# Patient Record
Sex: Female | Born: 1970 | Race: White | Hispanic: No | Marital: Married | Smoking: Never smoker
Health system: Southern US, Community
[De-identification: ages and names within clinical notes are randomized; demographics above are authoritative.]

## PROBLEM LIST (undated history)

## (undated) DIAGNOSIS — N951 Menopausal and female climacteric states: Secondary | ICD-10-CM

## (undated) HISTORY — PX: DILATION AND CURETTAGE OF UTERUS: SHX78

## (undated) HISTORY — DX: Menopausal and female climacteric states: N95.1

## (undated) HISTORY — PX: BREAST BIOPSY: SHX20

---

## 2005-11-08 ENCOUNTER — Ambulatory Visit: Payer: Self-pay | Admitting: Obstetrics and Gynecology

## 2006-10-07 ENCOUNTER — Encounter: Payer: Self-pay | Admitting: Maternal & Fetal Medicine

## 2006-11-11 ENCOUNTER — Encounter: Payer: Self-pay | Admitting: Maternal & Fetal Medicine

## 2007-04-22 ENCOUNTER — Inpatient Hospital Stay: Payer: Self-pay | Admitting: Obstetrics and Gynecology

## 2007-04-22 ENCOUNTER — Observation Stay: Payer: Self-pay | Admitting: Obstetrics and Gynecology

## 2007-04-28 ENCOUNTER — Ambulatory Visit: Payer: Self-pay | Admitting: Pediatrics

## 2008-08-18 IMAGING — US US OB DETAIL+14 WK - NRPT [facility]
1 series · 14 of 28 positions shown · non-contrast
Comparison: none

[Series 1: us ob detail+14 wk - nrpt [facility] · 0.22mm/px · 14 of 56 slices shown]
[im 3/56]
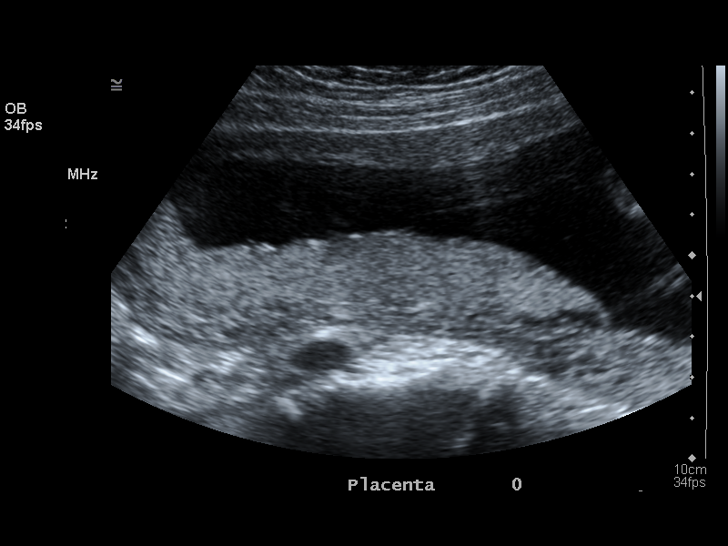
[im 7/56]
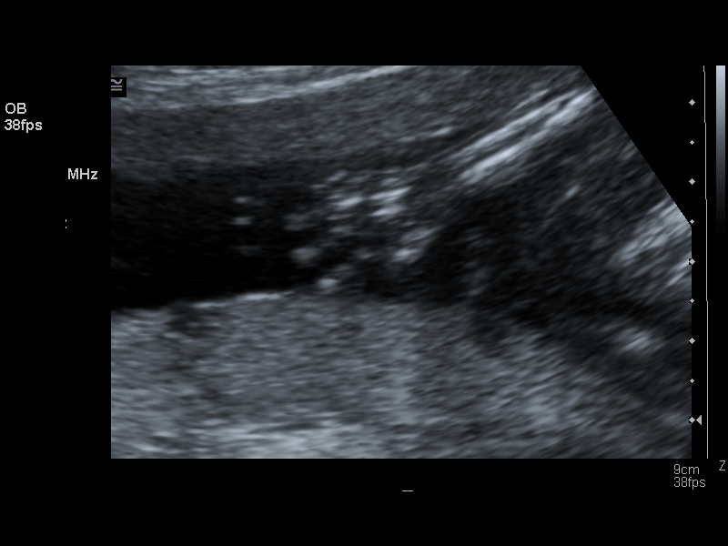
[im 11/56]
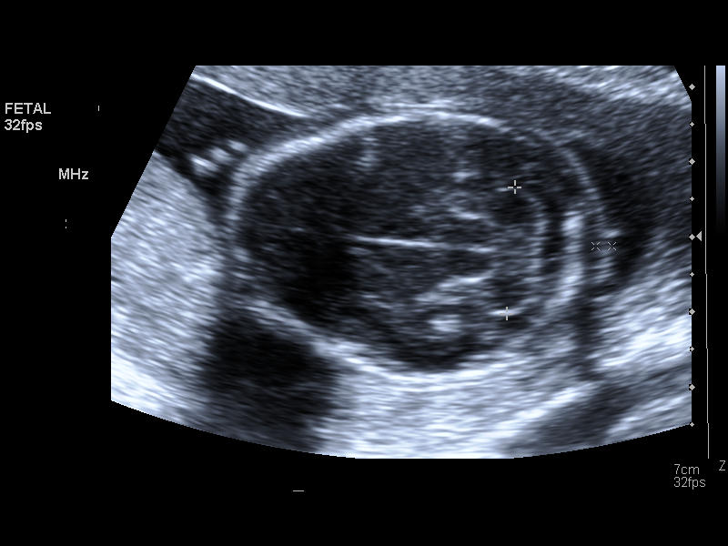
[im 15/56]
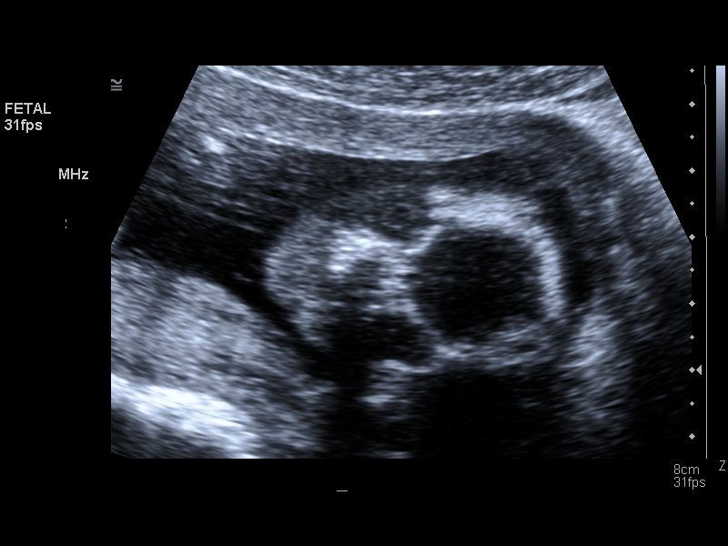
[im 19/56]
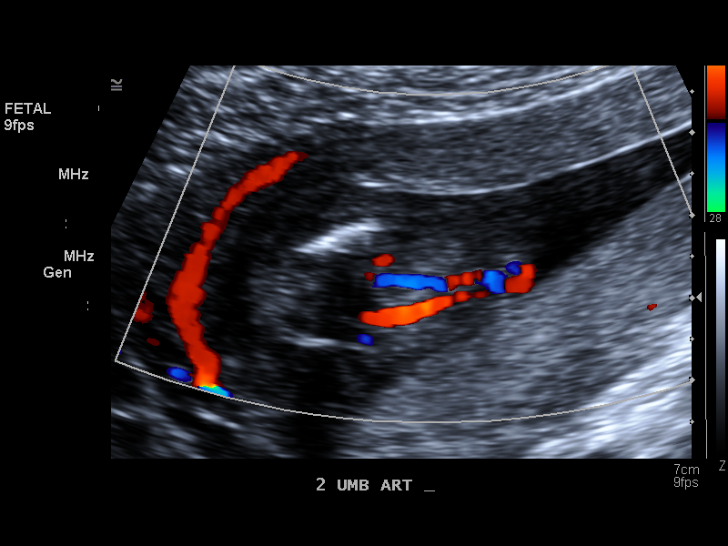
[im 23/56]
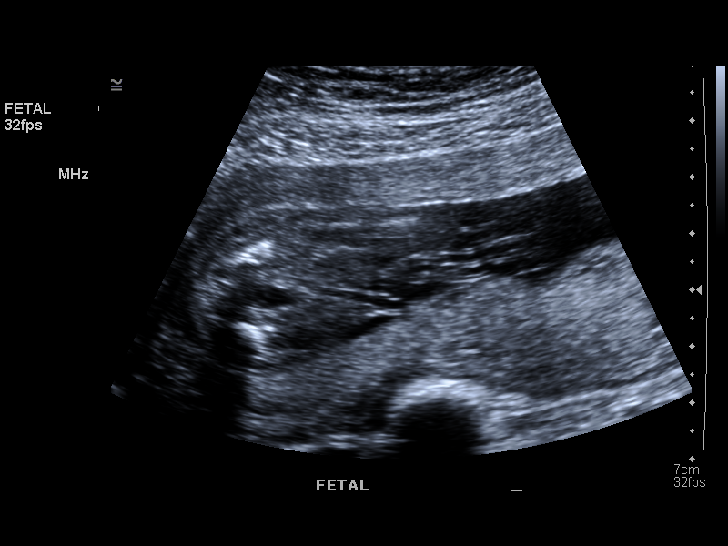
[im 27/56]
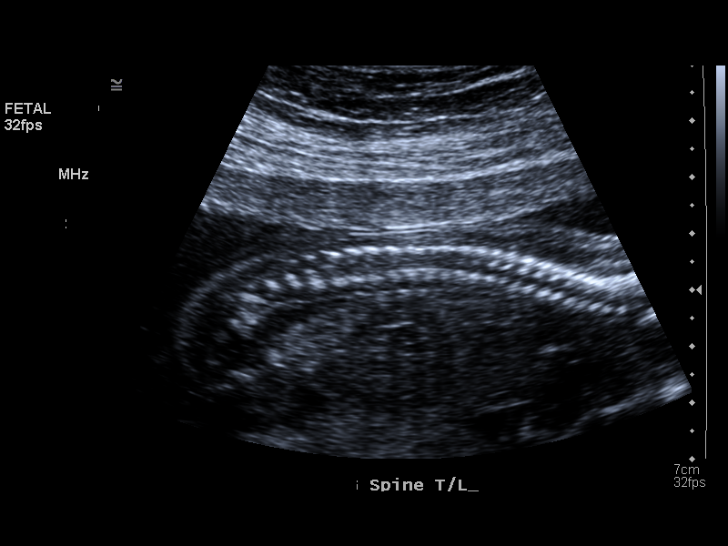
[im 31/56]
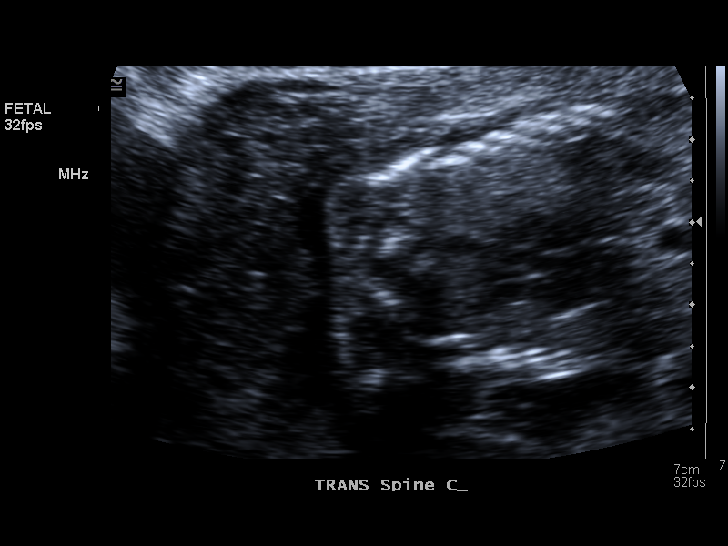
[im 35/56]
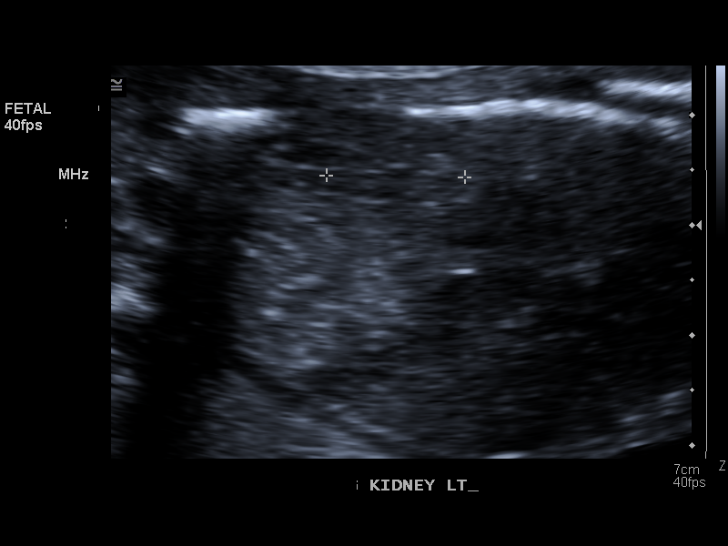
[im 39/56]
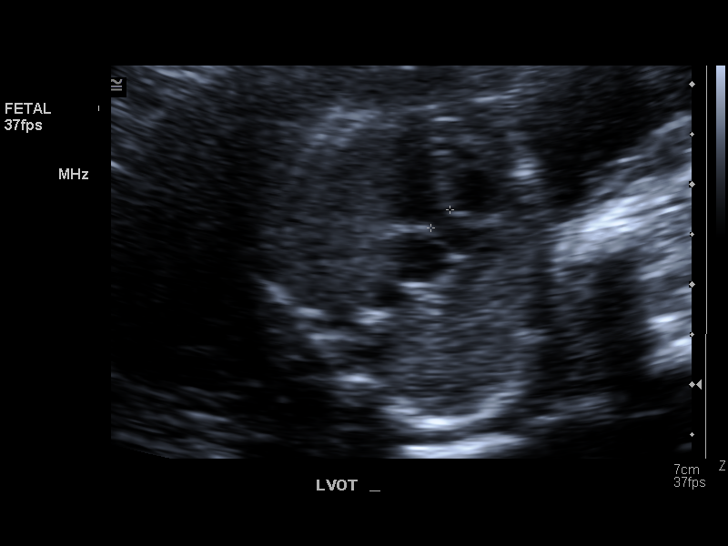
[im 43/56]
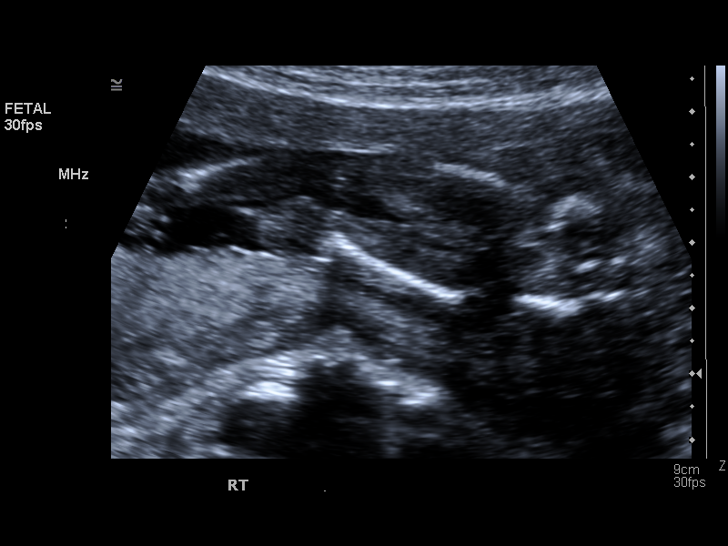
[im 47/56]
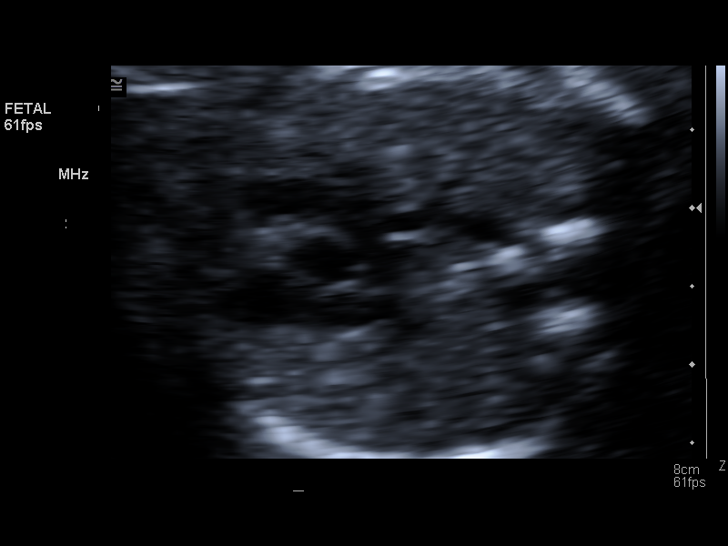
[im 51/56]
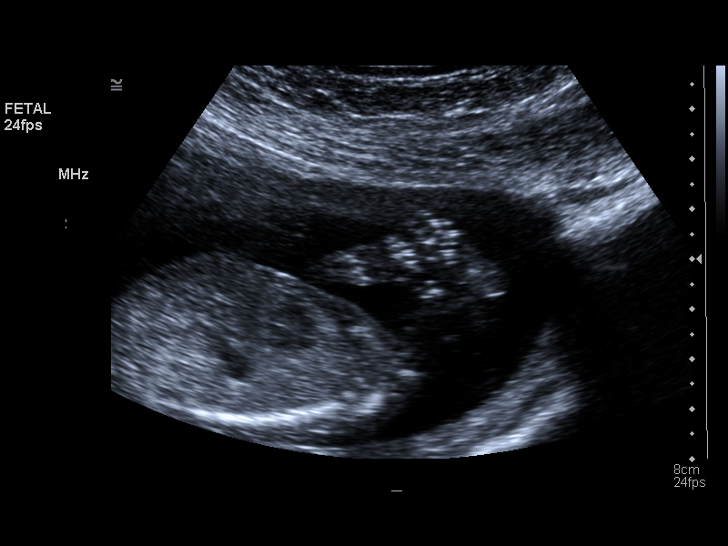
[im 56/56]
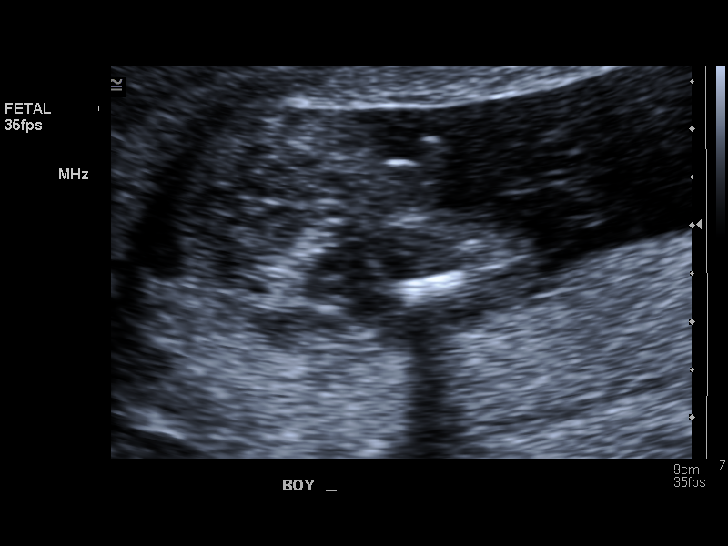

[14 of 28 positions shown; findings below may reference images not displayed]

IMAGES IMPORTED FROM THE SYNGO WORKFLOW SYSTEM
NO DICTATION FOR STUDY

## 2017-04-17 ENCOUNTER — Encounter: Payer: Self-pay | Admitting: Obstetrics and Gynecology

## 2017-04-17 ENCOUNTER — Ambulatory Visit (INDEPENDENT_AMBULATORY_CARE_PROVIDER_SITE_OTHER): Payer: BC Managed Care – PPO | Admitting: Obstetrics and Gynecology

## 2017-04-17 VITALS — BP 110/70 | HR 58 | Ht 60.0 in | Wt 126.0 lb

## 2017-04-17 DIAGNOSIS — Z01419 Encounter for gynecological examination (general) (routine) without abnormal findings: Secondary | ICD-10-CM

## 2017-04-17 DIAGNOSIS — Z124 Encounter for screening for malignant neoplasm of cervix: Secondary | ICD-10-CM

## 2017-04-17 DIAGNOSIS — Z1151 Encounter for screening for human papillomavirus (HPV): Secondary | ICD-10-CM

## 2017-04-17 DIAGNOSIS — Z1231 Encounter for screening mammogram for malignant neoplasm of breast: Secondary | ICD-10-CM

## 2017-04-17 DIAGNOSIS — Z30431 Encounter for routine checking of intrauterine contraceptive device: Secondary | ICD-10-CM | POA: Diagnosis not present

## 2017-04-17 DIAGNOSIS — Z1239 Encounter for other screening for malignant neoplasm of breast: Secondary | ICD-10-CM

## 2017-04-17 NOTE — Patient Instructions (Signed)
  [facility]: [phone]

## 2017-04-17 NOTE — Progress Notes (Signed)
PCP:  Patient, No Pcp Per   Chief Complaint  Patient presents with  . Gynecologic Exam     HPI:      Sylvia Perez is a 46 y.o. G2P1011 who LMP was Patient's last menstrual period was 04/15/2017., presents today for her annual examination.  Her menses are irregular with IUD. Spotting only.  Dysmenorrhea mild, occurring first 1-2 days of flow. She does not have intermenstrual bleeding. She does have night sweats/sleep disturbance.  Sex activity: single partner, contraception - IUD. Mirena placed 10/13. Pt would like another one.  Last Pap: October 25, 2014  Results were: no abnormalities /neg HPV DNA  Hx of STDs: none  Last mammogram: March 08, 2016  Results were: normal--routine follow-up in 12 months There is a FH of breast cancer in her pat great aunt #1. There is a FH of ovarian cancer in her pat grt aunt #1. Pt doesn't qualify for cancer genetic testing. The patient does not do self-breast exams.  Tobacco use: The patient denies current or previous tobacco use. Alcohol use: none No drug use.  Exercise: moderately active  She does get adequate calcium and Vitamin D in her diet.   Past Medical History:  Diagnosis Date  . Perimenopausal vasomotor symptoms     Past Surgical History:  Procedure Laterality Date  . DILATION AND CURETTAGE OF UTERUS      Family History  Problem Relation Age of Onset  . Breast cancer Other   . Ovarian cancer Other        vs cervical cancer    Social History   Social History  . Marital status: Married    Spouse name: N/A  . Number of children: N/A  . Years of education: N/A   Occupational History  . Not on file.   Social History Main Topics  . Smoking status: Never Smoker  . Smokeless tobacco: Never Used  . Alcohol use No  . Drug use: No  . Sexual activity: Yes    Birth control/ protection: IUD   Other Topics Concern  . Not on file   Social History Narrative  . No narrative on file    Current Meds    Medication Sig  . levonorgestrel (MIRENA) 20 MCG/24HR IUD 1 each by Intrauterine route once.     ROS:  Review of Systems  Constitutional: Negative for fatigue, fever and unexpected weight change.  Respiratory: Negative for cough, shortness of breath and wheezing.   Cardiovascular: Negative for chest pain, palpitations and leg swelling.  Gastrointestinal: Negative for blood in stool, constipation, diarrhea, nausea and vomiting.  Endocrine: Negative for cold intolerance, heat intolerance and polyuria.  Genitourinary: Negative for dyspareunia, dysuria, flank pain, frequency, genital sores, hematuria, menstrual problem, pelvic pain, urgency, vaginal bleeding, vaginal discharge and vaginal pain.  Musculoskeletal: Negative for back pain, joint swelling and myalgias.  Skin: Negative for rash.  Neurological: Negative for dizziness, syncope, light-headedness, numbness and headaches.  Hematological: Negative for adenopathy.  Psychiatric/Behavioral: Negative for agitation, confusion, sleep disturbance and suicidal ideas. The patient is not nervous/anxious.      Objective: BP 110/70   Pulse (!) 58   Ht 5' (1.524 m)   Wt 126 lb (57.2 kg)   LMP 04/15/2017   BMI 24.61 kg/m    Physical Exam  Constitutional: She is oriented to person, place, and time. She appears well-developed and well-nourished.  Genitourinary: Vagina normal and uterus normal. There is no rash or tenderness on the right labia. There is  no rash or tenderness on the left labia. No erythema or tenderness in the vagina. No vaginal discharge found. Right adnexum does not display mass and does not display tenderness. Left adnexum does not display mass and does not display tenderness. Cervix does not exhibit motion tenderness or polyp. Uterus is not enlarged or tender.  Neck: Normal range of motion. No thyromegaly present.  Cardiovascular: Normal rate, regular rhythm and normal heart sounds.   No murmur heard. Pulmonary/Chest:  Effort normal and breath sounds normal. Right breast exhibits no mass, no nipple discharge, no skin change and no tenderness. Left breast exhibits no mass, no nipple discharge, no skin change and no tenderness.  Abdominal: Soft. There is no tenderness. There is no guarding.  Musculoskeletal: Normal range of motion.  Neurological: She is alert and oriented to person, place, and time. No cranial nerve deficit.  Psychiatric: She has a normal mood and affect. Her behavior is normal.  Vitals reviewed.    Assessment/Plan: Encounter for annual routine gynecological examination  Cervical cancer screening - Plan: IGP, Aptima HPV  Screening for HPV (human papillomavirus) - Plan: IGP, Aptima HPV  Encounter for routine checking of intrauterine contraceptive device (IUD) - IUD in place. Due for rem 10/18. RTO for removal/insertion. NSAIDs.   Screening for breast cancer - Pt to sched mammo. - Plan: MM DIGITAL SCREENING BILATERAL           GYN counsel breast self exam, mammography screening, family planning choices, adequate intake of calcium and vitamin D, diet and exercise     F/U  Return in about 1 year (around 04/17/2018).  Sylvia B. Copland, PA-C 04/17/2017 11:20 AM

## 2017-04-19 LAB — IGP, APTIMA HPV
HPV Aptima: NEGATIVE
PAP Smear Comment: 0

## 2017-04-24 ENCOUNTER — Encounter: Payer: Self-pay | Admitting: Obstetrics and Gynecology

## 2017-04-29 ENCOUNTER — Encounter: Payer: Self-pay | Admitting: Obstetrics and Gynecology

## 2017-05-29 ENCOUNTER — Telehealth: Payer: Self-pay | Admitting: Obstetrics and Gynecology

## 2017-05-29 NOTE — Telephone Encounter (Signed)
Pt is schedule 06/05/17 with [facility] for mirena removal and insertion

## 2017-06-05 ENCOUNTER — Ambulatory Visit: Payer: BC Managed Care – PPO | Admitting: Obstetrics and Gynecology

## 2017-06-05 ENCOUNTER — Encounter: Payer: Self-pay | Admitting: Obstetrics and Gynecology

## 2017-06-05 VITALS — BP 120/80 | HR 59 | Ht 60.0 in | Wt 129.0 lb

## 2017-06-05 DIAGNOSIS — Z30433 Encounter for removal and reinsertion of intrauterine contraceptive device: Secondary | ICD-10-CM | POA: Diagnosis not present

## 2017-06-05 MED ORDER — LEVONORGESTREL 20 MCG/24HR IU IUD: 1.0000 | INTRAUTERINE_SYSTEM | Freq: Once | INTRAUTERINE | 0 refills | Status: DC

## 2017-06-05 NOTE — Patient Instructions (Signed)
I value your feedback and appreciate you entrusting us with your care. If you get a [facility] patient survey, I would appreciate you taking the time to let us know what your experience was like. Thank you!   [facility] [phone]  Instructions after IUD insertion  Most women experience no significant problems after insertion of an IUD, however minor cramping and spotting for a few days is common. Cramps may be treated with ibuprofen 800mg  every 8 hours or Tylenol 650 mg every 4 hours. Contact [facility] immediately if you experience any of the following symptoms during the next week: temperature >99.6 degrees, worsening pelvic pain, abdominal pain, fainting, unusually heavy vaginal bleeding, foul vaginal discharge, or if you think you have expelled the IUD.  Nothing inserted in the vagina for 48 hours. You will be scheduled for a follow up visit in approximately four weeks.  You should check monthly to be sure you can feel the IUD strings in the upper vagina. If you are having a monthly period, try to check after each period. If you cannot feel the IUD strings,  contact [facility] immediately so we can do an exam to determine if the IUD has been expelled.   Please use backup protection until we can confirm the IUD is in place.  Call [facility] if you are exposed to or diagnosed with a sexually transmitted infection, as we will need to discuss whether it is safe for you to continue using an IUD.

## 2017-06-05 NOTE — Progress Notes (Signed)
   Chief Complaint  Patient presents with  . Contraception     History of Present Illness:  Sylvia LarocheChristina Perez is a 46 y.o. that had a Mirena IUD placed approximately 5 years ago. Since that time, she denies dyspareunia, pelvic pain, non-menstrual bleeding, vaginal d/c, heavy bleeding. She would like another Mirena.   BP 120/80   Pulse (!) 59   Ht 5' (1.524 m)   Wt 129 lb (58.5 kg)   LMP 06/03/2017   BMI 25.19 kg/m   Pelvic exam:  Two IUD strings present seen coming from the cervical os. EGBUS, vaginal vault and cervix: within normal limits  IUD Removal Strings of IUD identified and grasped.  IUD removed without problem with ring forceps.  Pt tolerated this well.  IUD noted to be intact.   IUD Insertion Procedure Note Patient identified, informed consent performed, consent signed.   Discussed risks of irregular bleeding, cramping, infection, malpositioning or misplacement of the IUD outside the uterus which may require further procedure such as laparoscopy, risk of failure <1%. Time out was performed.    Speculum placed in the vagina.  Cervix visualized.  Cleaned with Betadine x 2.  Grasped anteriorly with a single tooth tenaculum.  Uterus sounded to 7.0 cm.   IUD placed per manufacturer's recommendations.  Strings trimmed to 3 cm. Tenaculum was removed, good hemostasis noted.  Patient tolerated procedure well.   ASSESSMENT:  Encounter for removal and reinsertion of intrauterine contraceptive device (IUD) - Plan: levonorgestrel (MIRENA) 20 MCG/24HR IUD   Meds ordered this encounter  Medications  . levonorgestrel (MIRENA) 20 MCG/24HR IUD    Sig: 1 Intra Uterine Device (1 each total) once for 1 dose by Intrauterine route.    Dispense:  1 each    Refill:  0     Plan:  Patient was given post-procedure instructions.  She was advised to have backup contraception for one week.   Call if you are having increasing pain, cramps or bleeding or if you have a fever greater than  100.4 degrees F., shaking chills, nausea or vomiting. Patient was also asked to check IUD strings periodically and follow up in 4 weeks for IUD check.  Return in about 4 weeks (around 07/03/2017) for IUD check.  Mehdi Gironda B. Jaecion Dempster, PA-C 06/05/2017 3:58 PM

## 2017-06-12 NOTE — Telephone Encounter (Signed)
Mirena stock was provided for this patient. 

## 2017-07-04 ENCOUNTER — Encounter: Payer: Self-pay | Admitting: Obstetrics and Gynecology

## 2017-07-04 ENCOUNTER — Ambulatory Visit (INDEPENDENT_AMBULATORY_CARE_PROVIDER_SITE_OTHER): Payer: BC Managed Care – PPO | Admitting: Obstetrics and Gynecology

## 2017-07-04 VITALS — BP 100/60 | HR 63 | Ht 60.0 in | Wt 130.0 lb

## 2017-07-04 DIAGNOSIS — Z30431 Encounter for routine checking of intrauterine contraceptive device: Secondary | ICD-10-CM | POA: Diagnosis not present

## 2017-07-04 NOTE — Patient Instructions (Signed)
I value your feedback and entrusting us with your care. If you get a [facility] patient survey, I would appreciate you taking the time to let us know about your experience today. Thank you! 

## 2017-07-04 NOTE — Progress Notes (Signed)
   Chief Complaint  Patient presents with  . Contraception     History of Present Illness:  Cindie LarocheChristina Betterton is a 46 y.o. that had a Mirena IUD placed approximately 1 month ago. Since that time, she denies dyspareunia, pelvic pain, non-menstrual bleeding, vaginal d/c, heavy bleeding.   Review of Systems  Constitutional: Negative for fever.  Gastrointestinal: Negative for blood in stool, constipation, diarrhea, nausea and vomiting.  Genitourinary: Negative for dyspareunia, dysuria, flank pain, frequency, hematuria, urgency, vaginal bleeding, vaginal discharge and vaginal pain.  Musculoskeletal: Negative for back pain.  Skin: Negative for rash.    Physical Exam:  BP 100/60   Pulse 63   Ht 5' (1.524 m)   Wt 130 lb (59 kg)   LMP 06/29/2017   BMI 25.39 kg/m  Body mass index is 25.39 kg/m.  Pelvic exam:  Two IUD strings present seen coming from the cervical os. EGBUS, vaginal vault and cervix: within normal limits   Assessment:  Routine checking of IUD Encounter for routine checking of intrauterine contraceptive device (IUD)  IUD strings present in proper location; pt doing well  Plan: F/u if any signs of infection or can no longer feel the strings.   Denim Start B. Mycala Warshawsky, PA-C 07/04/2017 3:41 PM

## 2020-10-27 NOTE — Patient Instructions (Signed)
I value your feedback and you entrusting us with your care. If you get a [facility] patient survey, I would appreciate you taking the time to let us know about your experience today. Thank you! ? ? ?

## 2020-10-27 NOTE — Progress Notes (Signed)
PCP:  Patient, No Pcp Per (Inactive)   Chief Complaint  Patient presents with  . Gynecologic Exam    No concerns     HPI:      Ms. Rexann Lueras is a 50 y.o. G2P1011 who LMP was No LMP recorded. (Menstrual status: IUD)., presents today for her NP> 3 yrs annual examination.  Her menses are monthly spotting with IUD, lasting 3-5 days.  Dysmenorrhea mild, occurring first 1-2 days of flow. She rarely has intermenstrual bleeding.   Sex activity: single partner, contraception - IUD. Mirena replaced 06/05/17. Has decreased libido.  Last Pap: 04/17/17  Results were: no abnormalities /neg HPV DNA  Hx of STDs: none  Last mamm[facility]: 04/24/17 at [facility]; Results were: normal--routine follow-up in 12 months There is no FH of breast cancer or ovarian cancer. Previous pat grt aunts with cancer turned out to not be of blood relation to pt. The patient does not do self-breast exams.  Tobacco use: The patient denies current or previous tobacco use. Alcohol use: none No drug use.  Exercise: moderately active  Colonoscopy: never  She does get adequate calcium and Vitamin D in her diet.   Past Medical History:  Diagnosis Date  . Perimenopausal vasomotor symptoms     Past Surgical History:  Procedure Laterality Date  . DILATION AND CURETTAGE OF UTERUS      Family History  Problem Relation Age of Onset  . Breast cancer Neg Hx   . Ovarian cancer Neg Hx     Social History   Socioeconomic History  . Marital status: Married    Spouse name: Not on file  . Number of children: Not on file  . Years of education: Not on file  . Highest education level: Not on file  Occupational History  . Not on file  Tobacco Use  . Smoking status: Never Smoker  . Smokeless tobacco: Never Used  Vaping Use  . Vaping Use: Never used  Substance and Sexual Activity  . Alcohol use: No  . Drug use: No  . Sexual activity: Yes    Birth control/protection: I.U.D.    Comment: Mirena  Other Topics Concern   . Not on file  Social History Narrative  . Not on file   Social Determinants of Health   Financial Resource Strain: Not on file  Food Insecurity: Not on file  Transportation Needs: Not on file  Physical Activity: Not on file  Stress: Not on file  Social Connections: Not on file  Intimate Partner Violence: Not on file    Current Meds  Medication Sig  . Cholecalciferol (VITAMIN D3) 10 MCG (400 UNIT) tablet Take by mouth.     ROS:  Review of Systems  Constitutional: Negative for fatigue, fever and unexpected weight change.  Respiratory: Negative for cough, shortness of breath and wheezing.   Cardiovascular: Negative for chest pain, palpitations and leg swelling.  Gastrointestinal: Negative for blood in stool, constipation, diarrhea, nausea and vomiting.  Endocrine: Negative for cold intolerance, heat intolerance and polyuria.  Genitourinary: Negative for dyspareunia, dysuria, flank pain, frequency, genital sores, hematuria, menstrual problem, pelvic pain, urgency, vaginal bleeding, vaginal discharge and vaginal pain.  Musculoskeletal: Negative for back pain, joint swelling and myalgias.  Skin: Negative for rash.  Neurological: Negative for dizziness, syncope, light-headedness, numbness and headaches.  Hematological: Negative for adenopathy.  Psychiatric/Behavioral: Negative for agitation, confusion, sleep disturbance and suicidal ideas. The patient is not nervous/anxious.      Objective: BP 114/70   Ht  5' (1.524 m)   Wt 136 lb (61.7 kg)   BMI 26.56 kg/m    Physical Exam Constitutional:      Appearance: She is well-developed.  Genitourinary:     Vulva normal.     Right Labia: No rash, tenderness or lesions.    Left Labia: No tenderness, lesions or rash.    No vaginal discharge, erythema or tenderness.      Right Adnexa: not tender and no mass present.    Left Adnexa: not tender and no mass present.    No cervical motion tenderness, friability or polyp.     IUD  strings visualized.     Uterus is not enlarged or tender.  Breasts:     Right: No mass, nipple discharge, skin change or tenderness.     Left: No mass, nipple discharge, skin change or tenderness.    Neck:     Thyroid: No thyromegaly.  Cardiovascular:     Rate and Rhythm: Normal rate and regular rhythm.     Heart sounds: Normal heart sounds. No murmur heard.   Pulmonary:     Effort: Pulmonary effort is normal.     Breath sounds: Normal breath sounds.  Abdominal:     Palpations: Abdomen is soft.     Tenderness: There is no abdominal tenderness. There is no guarding or rebound.  Musculoskeletal:        General: Normal range of motion.     Cervical back: Normal range of motion.  Lymphadenopathy:     Cervical: No cervical adenopathy.  Neurological:     General: No focal deficit present.     Mental Status: She is alert and oriented to person, place, and time.     Cranial Nerves: No cranial nerve deficit.  Skin:    General: Skin is warm and dry.  Psychiatric:        Mood and Affect: Mood normal.        Behavior: Behavior normal.        Thought Content: Thought content normal.        Judgment: Judgment normal.  Vitals reviewed.      Assessment/Plan: Encounter for annual routine gynecological examination  Encounter for routine checking of intrauterine contraceptive device (IUD); due for removal 02/2024  Encounter for screening mammogram for malignant neoplasm of breast - Plan: MM 3D SCREEN BREAST BILATERAL; pt to sched mammo  Screening for colon cancer - Plan: Cologuard; colonoscopy/cologuard discussed. Pt elects cologuard. Ref sent. Will f/u with results.          GYN counsel breast self exam, mammography screening, adequate intake of calcium and vitamin D, diet and exercise     F/U  Return in about 1 year (around 10/28/2021).  Hailie Searight B. Paisli Silfies, PA-C 10/28/2020 3:39 PM

## 2020-10-28 ENCOUNTER — Other Ambulatory Visit: Payer: Self-pay

## 2020-10-28 ENCOUNTER — Ambulatory Visit (INDEPENDENT_AMBULATORY_CARE_PROVIDER_SITE_OTHER): Payer: BC Managed Care – PPO | Admitting: Obstetrics and Gynecology

## 2020-10-28 ENCOUNTER — Encounter: Payer: Self-pay | Admitting: Obstetrics and Gynecology

## 2020-10-28 VITALS — BP 114/70 | Ht 60.0 in | Wt 136.0 lb

## 2020-10-28 DIAGNOSIS — Z01419 Encounter for gynecological examination (general) (routine) without abnormal findings: Secondary | ICD-10-CM | POA: Diagnosis not present

## 2020-10-28 DIAGNOSIS — Z30431 Encounter for routine checking of intrauterine contraceptive device: Secondary | ICD-10-CM

## 2020-10-28 DIAGNOSIS — Z1211 Encounter for screening for malignant neoplasm of colon: Secondary | ICD-10-CM

## 2020-10-28 DIAGNOSIS — Z1231 Encounter for screening mammogram for malignant neoplasm of breast: Secondary | ICD-10-CM | POA: Diagnosis not present

## 2020-12-14 ENCOUNTER — Telehealth: Payer: Self-pay | Admitting: Obstetrics and Gynecology

## 2020-12-14 NOTE — Telephone Encounter (Signed)
Pt with Cat 4B mammo results RT breast at [facility]. Has bx on 12/16/20. Will wait for results and f/u. Questions answered.

## 2020-12-16 ENCOUNTER — Emergency Department
Admission: EM | Admit: 2020-12-16 | Discharge: 2020-12-16 | Disposition: A | Discharge status: Home / Self Care | Payer: BC Managed Care – PPO | Attending: Emergency Medicine | Admitting: Emergency Medicine

## 2020-12-16 DIAGNOSIS — Z5189 Encounter for other specified aftercare: Secondary | ICD-10-CM | POA: Insufficient documentation

## 2020-12-16 DIAGNOSIS — Z4800 Encounter for change or removal of nonsurgical wound dressing: Secondary | ICD-10-CM | POA: Diagnosis present

## 2020-12-16 MED ORDER — ALPRAZOLAM 0.25 MG PO TABS
0.2500 mg | ORAL_TABLET | Freq: Once | ORAL | Status: AC
  Administered 2020-12-16: 0.25 mg via ORAL
  Filled 2020-12-16: qty 1

## 2020-12-16 NOTE — ED Triage Notes (Addendum)
Pt reports having right breast core needle biopsy done this morning, states approx 1 hour ago she sneezed, causing severe pain and "gushing of blood" from the site. Reports taking tylenol 2 hours PTA. Bleeding controlled at this time. New dressing placed in traige. Steri strips remain in place.

## 2020-12-16 NOTE — ED Provider Notes (Signed)
[facility]-EMERGENCY DEPARTMENT  ____________________________________________  Time seen: Approximately 9:30 PM  I have reviewed the triage vital signs and the nursing notes.   HISTORY  Chief Complaint Post-op Problem   Historian Patient   HPI Sylvia Perez is a 50 y.o. female presents to the emergency department after for a right breast core needle biopsy wound check.  Patient reports that she underwent procedure this morning and this afternoon she sneezed and biopsy site began bleeding.  She states that she soaked through a bandage and could not stop bleeding at home.  Bleeding had resolved while waiting in the emergency department.   Past Medical History:  Diagnosis Date  . Perimenopausal vasomotor symptoms      Immunizations up to date:  Yes.     Past Medical History:  Diagnosis Date  . Perimenopausal vasomotor symptoms     There are no problems to display for this patient.   Past Surgical History:  Procedure Laterality Date  . BREAST BIOPSY    . DILATION AND CURETTAGE OF UTERUS      Prior to Admission medications   Medication Sig Start Date End Date Taking? Authorizing Provider  Cholecalciferol (VITAMIN D3) 10 MCG (400 UNIT) tablet Take by mouth.    [provider]  levonorgestrel (MIRENA) 20 MCG/24HR IUD 1 Intra Uterine Device (1 each total) once for 1 dose by Intrauterine route. 06/05/17 06/05/17  Copland, Ilona Sorrel, PA-C    Allergies Patient has no known allergies.  Family History  Problem Relation Age of Onset  . Breast cancer Neg Hx   . Ovarian cancer Neg Hx     Social History Social History   Tobacco Use  . Smoking status: Never Smoker  . Smokeless tobacco: Never Used  Vaping Use  . Vaping Use: Never used  Substance Use Topics  . Alcohol use: No  . Drug use: No     Review of Systems  Constitutional: No fever/chills Eyes:  No discharge ENT: No upper respiratory complaints. Respiratory: no cough. No SOB/ use of accessory  muscles to breath Gastrointestinal:   No nausea, no vomiting.  No diarrhea.  No constipation. Musculoskeletal: Negative for musculoskeletal pain. Skin: Patient has wound concern.    ____________________________________________   PHYSICAL EXAM:  VITAL SIGNS: ED Triage Vitals  Enc Vitals Group     BP 12/16/20 1943 118/81     Pulse Rate 12/16/20 1940 85     Resp 12/16/20 1940 18     Temp 12/16/20 1940 98.3 F (36.8 C)     Temp src --      SpO2 12/16/20 1940 94 %     Weight --      Height --      Head Circumference --      Peak Flow --      Pain Score 12/16/20 1940 6     Pain Loc --      Pain Edu? --      Excl. in GC? --      Constitutional: Alert and oriented. Well appearing and in no acute distress. Eyes: Conjunctivae are normal. PERRL. EOMI. Head: Atraumatic. ENT: Cardiovascular: Normal rate, regular rhythm. Normal S1 and S2.  Good peripheral circulation. Respiratory: Normal respiratory effort without tachypnea or retractions. Lungs CTAB. Good air entry to the bases with no decreased or absent breath sounds Gastrointestinal: Bowel sounds x 4 quadrants. Soft and nontender to palpation. No guarding or rigidity. No distention. Musculoskeletal: Full range of motion to all extremities. No obvious deformities noted  Neurologic:  Normal for age. No gross focal neurologic deficits are appreciated.  Skin: Patient has right breast core needle biopsy wound site that is not actively bleeding. Psychiatric: Mood and affect are normal for age. Speech and behavior are normal.   ____________________________________________   LABS (all labs ordered are listed, but only abnormal results are displayed)  Labs Reviewed - No data to display ____________________________________________  EKG   ____________________________________________  RADIOLOGY   No results found.  ____________________________________________    PROCEDURES  Procedure(s) performed:      Procedures     Medications  ALPRAZolam (XANAX) tablet 0.25 mg (has no administration in time range)     ____________________________________________   INITIAL IMPRESSION / ASSESSMENT AND PLAN / ED COURSE  Pertinent labs & imaging results that were available during my care of the patient were reviewed by me and considered in my medical decision making (see chart for details).      Assessment and plan Wound check Anxiety 50 year old female presents to the emergency department for a right core needle biopsy wound check of the right breast.  Patient had no active bleeding.  Patient's dressing was changed and low-dose Xanax was given prior to discharge for anxiety.  Patient education regarding wound care was given.  All patient questions were answered.     ____________________________________________  FINAL CLINICAL IMPRESSION(S) / ED DIAGNOSES  Final diagnoses:  Visit for wound check      NEW MEDICATIONS STARTED DURING THIS VISIT:  ED Discharge Orders    None          This chart was dictated using voice recognition software/Dragon. Despite best efforts to proofread, errors can occur which can change the meaning. Any change was purely unintentional.     Gasper Lloyd 12/16/20 2132    Phineas Semen, MD 12/16/20 2154

## 2021-01-03 ENCOUNTER — Telehealth: Payer: Self-pay

## 2021-01-03 NOTE — Telephone Encounter (Signed)
Called pt to f/u on test, is she still planning on completing? Pt says yes, she is just having a hard time finding time to complete.

## 2022-03-27 ENCOUNTER — Ambulatory Visit (INDEPENDENT_AMBULATORY_CARE_PROVIDER_SITE_OTHER): Payer: BC Managed Care – PPO | Admitting: Obstetrics and Gynecology

## 2022-03-27 ENCOUNTER — Other Ambulatory Visit (HOSPITAL_COMMUNITY)
Admission: RE | Admit: 2022-03-27 | Discharge: 2022-03-27 | Disposition: A | Discharge status: Home / Self Care | Payer: BC Managed Care – PPO | Source: Ambulatory Visit | Attending: Obstetrics and Gynecology | Admitting: Obstetrics and Gynecology

## 2022-03-27 ENCOUNTER — Encounter: Payer: Self-pay | Admitting: Obstetrics and Gynecology

## 2022-03-27 VITALS — BP 100/70 | Ht 60.0 in | Wt 140.0 lb

## 2022-03-27 DIAGNOSIS — Z1151 Encounter for screening for human papillomavirus (HPV): Secondary | ICD-10-CM | POA: Diagnosis not present

## 2022-03-27 DIAGNOSIS — K5901 Slow transit constipation: Secondary | ICD-10-CM

## 2022-03-27 DIAGNOSIS — Z01419 Encounter for gynecological examination (general) (routine) without abnormal findings: Secondary | ICD-10-CM | POA: Diagnosis not present

## 2022-03-27 DIAGNOSIS — Z13220 Encounter for screening for lipoid disorders: Secondary | ICD-10-CM

## 2022-03-27 DIAGNOSIS — Z1211 Encounter for screening for malignant neoplasm of colon: Secondary | ICD-10-CM

## 2022-03-27 DIAGNOSIS — Z124 Encounter for screening for malignant neoplasm of cervix: Secondary | ICD-10-CM | POA: Insufficient documentation

## 2022-03-27 DIAGNOSIS — Z30431 Encounter for routine checking of intrauterine contraceptive device: Secondary | ICD-10-CM

## 2022-03-27 DIAGNOSIS — Z1231 Encounter for screening mammogram for malignant neoplasm of breast: Secondary | ICD-10-CM

## 2022-03-27 DIAGNOSIS — Z0000 Encounter for general adult medical examination without abnormal findings: Secondary | ICD-10-CM

## 2022-03-27 NOTE — Patient Instructions (Signed)
I value your feedback and you entrusting us with your care. If you get a [facility] patient survey, I would appreciate you taking the time to let us know about your experience today. Thank you!  [facility]: [phone]      

## 2022-03-27 NOTE — Progress Notes (Signed)
PCP:  [facility]   Chief Complaint  Patient presents with   Gynecologic Exam    Feeling bloated, constipation x 6 months     HPI:      [name] is a 51 y.o. G2P1011 who LMP was No LMP recorded. (Menstrual status: IUD)., presents today for her annual examination.  Her menses are infrequent to absent now with IUD, lasting 1-2 days light flow. No bleeding for 6 months; were monthly last yr.  No BTB, no dysmen. Is having some vasomotor sx now.   Sex activity: single partner, contraception - IUD. Mirena replaced 06/05/17. No pain; dryness improved with lubricants. Last Pap: 04/17/17  Results were: no abnormalities /neg HPV DNA  Hx of STDs: none  Last mammogram: 12/14/20 at [facility]; Results were: cat 4B RT breast with neg bx; repeat mammo due 12 months.  There is no FH of breast cancer or ovarian cancer. Previous pat grt aunts with cancer turned out to not be of blood relation to pt. The patient does not do self-breast exams.  Tobacco use: The patient denies current or previous tobacco use. Alcohol use: none No drug use.  Exercise: moderately active  Colonoscopy: never; cologuard ordered 2022, not done. Pt having issues with constipation and bloating. Added probiotics last month with some improvement but still with sx. No rectal bleeding.   She does get adequate calcium and Vitamin D in her diet. No recent labs  Past Medical History:  Diagnosis Date   Perimenopausal vasomotor symptoms     Past Surgical History:  Procedure Laterality Date   BREAST BIOPSY     DILATION AND CURETTAGE OF UTERUS      Family History  Problem Relation Age of Onset   Dementia Mother    Lymphoma Mother    Breast cancer Neg Hx    Ovarian cancer Neg Hx     Social History   Socioeconomic History   Marital status: Married    Spouse name: Not on file   Number of children: Not on file   Years of education: Not on file   Highest education level: Not on file  Occupational  History   Not on file  Tobacco Use   Smoking status: Never   Smokeless tobacco: Never  Vaping Use   Vaping Use: Never used  Substance and Sexual Activity   Alcohol use: No   Drug use: No   Sexual activity: Yes    Birth control/protection: I.U.D.    Comment: Mirena  Other Topics Concern   Not on file  Social History Narrative   Not on file   Social Determinants of Health   Financial Resource Strain: Not on file  Food Insecurity: Not on file  Transportation Needs: Not on file  Physical Activity: Not on file  Stress: Not on file  Social Connections: Not on file  Intimate Partner Violence: Not on file    Current Meds  Medication Sig   ascorbic acid (VITAMIN C) 100 MG tablet Take by mouth.   Cholecalciferol (VITAMIN D3) 10 MCG (400 UNIT) tablet Take by mouth.     ROS:  Review of Systems  Constitutional:  Negative for fatigue, fever and unexpected weight change.  Respiratory:  Negative for cough, shortness of breath and wheezing.   Cardiovascular:  Negative for chest pain, palpitations and leg swelling.  Gastrointestinal:  Positive for abdominal distention and constipation. Negative for blood in stool, diarrhea, nausea and vomiting.  Endocrine: Negative for cold intolerance, heat  intolerance and polyuria.  Genitourinary:  Negative for dyspareunia, dysuria, flank pain, frequency, genital sores, hematuria, menstrual problem, pelvic pain, urgency, vaginal bleeding, vaginal discharge and vaginal pain.  Musculoskeletal:  Negative for back pain, joint swelling and myalgias.  Skin:  Negative for rash.  Neurological:  Negative for dizziness, syncope, light-headedness, numbness and headaches.  Hematological:  Negative for adenopathy.  Psychiatric/Behavioral:  Negative for agitation, confusion, sleep disturbance and suicidal ideas. The patient is not nervous/anxious.      Objective: BP 100/70   Ht 5' (1.524 m)   Wt 140 lb (63.5 kg)   BMI 27.34 kg/m    Physical  Exam Constitutional:      Appearance: She is well-developed.  Genitourinary:     Vulva normal.     Right Labia: No rash, tenderness or lesions.    Left Labia: No tenderness, lesions or rash.    No vaginal discharge, erythema or tenderness.      Right Adnexa: not tender and no mass present.    Left Adnexa: not tender and no mass present.    No cervical motion tenderness, friability or polyp.     IUD strings visualized.     Uterus is not enlarged or tender.  Breasts:    Right: No mass, nipple discharge, skin change or tenderness.     Left: No mass, nipple discharge, skin change or tenderness.  Neck:     Thyroid: No thyromegaly.  Cardiovascular:     Rate and Rhythm: Normal rate and regular rhythm.     Heart sounds: Normal heart sounds. No murmur heard. Pulmonary:     Effort: Pulmonary effort is normal.     Breath sounds: Normal breath sounds.  Abdominal:     Palpations: Abdomen is soft.     Tenderness: There is no abdominal tenderness. There is no guarding or rebound.  Musculoskeletal:        General: Normal range of motion.     Cervical back: Normal range of motion.  Lymphadenopathy:     Cervical: No cervical adenopathy.  Neurological:     General: No focal deficit present.     Mental Status: She is alert and oriented to person, place, and time.     Cranial Nerves: No cranial nerve deficit.  Skin:    General: Skin is warm and dry.  Psychiatric:        Mood and Affect: Mood normal.        Behavior: Behavior normal.        Thought Content: Thought content normal.        Judgment: Judgment normal.  Vitals reviewed.      Assessment/Plan: Encounter for annual routine gynecological examination  Cervical cancer screening - Plan: Cytology - PAP  Screening for HPV (human papillomavirus) - Plan: Cytology - PAP  Encounter for routine checking of intrauterine contraceptive device (IUD)--has 8 yr indication. Will cont to follow bleeding.   Encounter for screening mammogram  for malignant neoplasm of breast - Plan: MM 3D SCREEN BREAST BILATERAL; pt to schedule mammo  Screening for colon cancer - Plan: Cologuard; colonoscopy/cologuard discussed. Pt elects cologuard. Ref sent. Will f/u with results. If constipation doesn't improve, will do colonoscopy instead.   Slow transit constipation--cont probiotics. Add fiber supp/increase water. F/u prn. Will do colonoscopy prn.   Blood tests for routine general physical examination - Plan: Comprehensive metabolic panel, Lipid panel  Screening cholesterol level - Plan: Lipid panel  GYN counsel breast self exam, mammography screening, adequate intake of calcium  and vitamin D, diet and exercise     F/U  Return in about 1 year (around 03/28/2023).  Maddon Horton B. Charels Stambaugh, PA-C 03/27/2022 4:51 PM

## 2022-03-30 LAB — CYTOLOGY - PAP
Comment: NEGATIVE
Diagnosis: NEGATIVE
High risk HPV: NEGATIVE

## 2022-07-25 ENCOUNTER — Encounter: Payer: Self-pay | Admitting: Obstetrics and Gynecology

## 2022-07-27 ENCOUNTER — Other Ambulatory Visit: Payer: BC Managed Care – PPO

## 2022-07-27 DIAGNOSIS — Z0000 Encounter for general adult medical examination without abnormal findings: Secondary | ICD-10-CM

## 2022-07-27 DIAGNOSIS — Z13220 Encounter for screening for lipoid disorders: Secondary | ICD-10-CM

## 2022-07-28 LAB — COMPREHENSIVE METABOLIC PANEL
ALT: 25 IU/L (ref 0–32)
AST: 23 IU/L (ref 0–40)
Albumin/Globulin Ratio: 2.3 — ABNORMAL HIGH (ref 1.2–2.2)
Albumin: 4.6 g/dL (ref 3.8–4.9)
Alkaline Phosphatase: 68 IU/L (ref 44–121)
BUN/Creatinine Ratio: 19 (ref 9–23)
BUN: 16 mg/dL (ref 6–24)
Bilirubin Total: 0.4 mg/dL (ref 0.0–1.2)
CO2: 25 mmol/L (ref 20–29)
Calcium: 9.6 mg/dL (ref 8.7–10.2)
Chloride: 103 mmol/L (ref 96–106)
Creatinine, Ser: 0.84 mg/dL (ref 0.57–1.00)
Globulin, Total: 2 g/dL (ref 1.5–4.5)
Glucose: 86 mg/dL (ref 70–99)
Potassium: 4.4 mmol/L (ref 3.5–5.2)
Sodium: 141 mmol/L (ref 134–144)
Total Protein: 6.6 g/dL (ref 6.0–8.5)
eGFR: 84 mL/min/{1.73_m2} (ref >59)

## 2022-07-28 LAB — LIPID PANEL
Chol/HDL Ratio: 3 ratio (ref 0.0–4.4)
Cholesterol, Total: 160 mg/dL (ref 100–199)
HDL: 53 mg/dL (ref >39)
LDL Chol Calc (NIH): 95 mg/dL (ref 0–99)
Triglycerides: 58 mg/dL (ref 0–149)
VLDL Cholesterol Cal: 12 mg/dL (ref 5–40)

## 2023-06-19 NOTE — Progress Notes (Unsigned)
PCP:  [facility]   No chief complaint on file.    HPI:      [name] is a 52 y.o. G2P1011 who LMP was No LMP recorded. (Menstrual status: IUD)., presents today for her annual examination.  Her menses are infrequent to absent now with IUD, lasting 1-2 days light flow. No bleeding for 6 months; were monthly last yr.  No BTB, no dysmen. Is having some vasomotor sx now.   Sex activity: single partner, contraception - IUD. Mirena replaced 06/05/17. No pain; dryness improved with lubricants. Last Pap: 03/27/22 Results were: no abnormalities /neg HPV DNA  Hx of STDs: none  Last mammogram: 07/24/22 at [facility]; Results were normal, repeat in 12 months. S/p cat 4B RT breast with neg bx 5/22  There is no FH of breast cancer or ovarian cancer. Previous pat grt aunts with cancer turned out to not be of blood relation to pt. The patient does not do self-breast exams.  Tobacco use: The patient denies current or previous tobacco use. Alcohol use: none No drug use.  Exercise: moderately active  Colonoscopy: never; cologuard ordered 2022, not done. Pt having issues with constipation and bloating. Added probiotics last month with some improvement but still with sx. No rectal bleeding.   Normal labs 12/23  Past Medical History:  Diagnosis Date   Perimenopausal vasomotor symptoms     Past Surgical History:  Procedure Laterality Date   BREAST BIOPSY     DILATION AND CURETTAGE OF UTERUS      Family History  Problem Relation Age of Onset   Dementia Mother    Lymphoma Mother    Breast cancer Neg Hx    Ovarian cancer Neg Hx     Social History   Socioeconomic History   Marital status: Married    Spouse name: Not on file   Number of children: Not on file   Years of education: Not on file   Highest education level: Not on file  Occupational History   Not on file  Tobacco Use   Smoking status: Never   Smokeless tobacco: Never  Vaping Use   Vaping status:  Never Used  Substance and Sexual Activity   Alcohol use: No   Drug use: No   Sexual activity: Yes    Birth control/protection: I.U.D.    Comment: Mirena  Other Topics Concern   Not on file  Social History Narrative   Not on file   Social Determinants of Health   Financial Resource Strain: Not on file  Food Insecurity: Not on file  Transportation Needs: Not on file  Physical Activity: Not on file  Stress: Not on file  Social Connections: Not on file  Intimate Partner Violence: Not on file    No outpatient medications have been marked as taking for the 06/20/23 encounter (Appointment) with Miraj Truss, Ilona Sorrel, PA-C.     ROS:  Review of Systems  Constitutional:  Negative for fatigue, fever and unexpected weight change.  Respiratory:  Negative for cough, shortness of breath and wheezing.   Cardiovascular:  Negative for chest pain, palpitations and leg swelling.  Gastrointestinal:  Positive for abdominal distention and constipation. Negative for blood in stool, diarrhea, nausea and vomiting.  Endocrine: Negative for cold intolerance, heat intolerance and polyuria.  Genitourinary:  Negative for dyspareunia, dysuria, flank pain, frequency, genital sores, hematuria, menstrual problem, pelvic pain, urgency, vaginal bleeding, vaginal discharge and vaginal pain.  Musculoskeletal:  Negative for back pain, joint swelling  and myalgias.  Skin:  Negative for rash.  Neurological:  Negative for dizziness, syncope, light-headedness, numbness and headaches.  Hematological:  Negative for adenopathy.  Psychiatric/Behavioral:  Negative for agitation, confusion, sleep disturbance and suicidal ideas. The patient is not nervous/anxious.      Objective: There were no vitals taken for this visit.   Physical Exam Constitutional:      Appearance: She is well-developed.  Genitourinary:     Vulva normal.     Right Labia: No rash, tenderness or lesions.    Left Labia: No tenderness, lesions or  rash.    No vaginal discharge, erythema or tenderness.      Right Adnexa: not tender and no mass present.    Left Adnexa: not tender and no mass present.    No cervical motion tenderness, friability or polyp.     IUD strings visualized.     Uterus is not enlarged or tender.  Breasts:    Right: No mass, nipple discharge, skin change or tenderness.     Left: No mass, nipple discharge, skin change or tenderness.  Neck:     Thyroid: No thyromegaly.  Cardiovascular:     Rate and Rhythm: Normal rate and regular rhythm.     Heart sounds: Normal heart sounds. No murmur heard. Pulmonary:     Effort: Pulmonary effort is normal.     Breath sounds: Normal breath sounds.  Abdominal:     Palpations: Abdomen is soft.     Tenderness: There is no abdominal tenderness. There is no guarding or rebound.  Musculoskeletal:        General: Normal range of motion.     Cervical back: Normal range of motion.  Lymphadenopathy:     Cervical: No cervical adenopathy.  Neurological:     General: No focal deficit present.     Mental Status: She is alert and oriented to person, place, and time.     Cranial Nerves: No cranial nerve deficit.  Skin:    General: Skin is warm and dry.  Psychiatric:        Mood and Affect: Mood normal.        Behavior: Behavior normal.        Thought Content: Thought content normal.        Judgment: Judgment normal.  Vitals reviewed.      Assessment/Plan: Encounter for annual routine gynecological examination  Cervical cancer screening - Plan: Cytology - PAP  Screening for HPV (human papillomavirus) - Plan: Cytology - PAP  Encounter for routine checking of intrauterine contraceptive device (IUD)--has 8 yr indication. Will cont to follow bleeding.   Encounter for screening mammogram for malignant neoplasm of breast - Plan: MM 3D SCREEN BREAST BILATERAL; pt to schedule mammo  Screening for colon cancer - Plan: Cologuard; colonoscopy/cologuard discussed. Pt elects  cologuard. Ref sent. Will f/u with results. If constipation doesn't improve, will do colonoscopy instead.   Slow transit constipation--cont probiotics. Add fiber supp/increase water. F/u prn. Will do colonoscopy prn.   Blood tests for routine general physical examination - Plan: Comprehensive metabolic panel, Lipid panel  Screening cholesterol level - Plan: Lipid panel  GYN counsel breast self exam, mammography screening, adequate intake of calcium and vitamin D, diet and exercise     F/U  No follow-ups on file.  Sarahbeth Cashin B. Larraine Argo, PA-C 06/19/2023 4:50 PM

## 2023-06-20 ENCOUNTER — Ambulatory Visit: Payer: BC Managed Care – PPO | Admitting: Obstetrics and Gynecology

## 2023-06-20 ENCOUNTER — Encounter: Payer: Self-pay | Admitting: Obstetrics and Gynecology

## 2023-06-20 VITALS — BP 109/70 | HR 73 | Ht 60.0 in | Wt 139.0 lb

## 2023-06-20 DIAGNOSIS — Z30431 Encounter for routine checking of intrauterine contraceptive device: Secondary | ICD-10-CM

## 2023-06-20 DIAGNOSIS — Z1231 Encounter for screening mammogram for malignant neoplasm of breast: Secondary | ICD-10-CM

## 2023-06-20 DIAGNOSIS — Z1211 Encounter for screening for malignant neoplasm of colon: Secondary | ICD-10-CM

## 2023-06-20 DIAGNOSIS — N951 Menopausal and female climacteric states: Secondary | ICD-10-CM

## 2023-06-20 DIAGNOSIS — Z01419 Encounter for gynecological examination (general) (routine) without abnormal findings: Secondary | ICD-10-CM | POA: Diagnosis not present

## 2023-06-20 NOTE — Patient Instructions (Signed)
I value your feedback and you entrusting us with your care. If you get a [facility] patient survey, I would appreciate you taking the time to let us know about your experience today. Thank you! ? ? ?

## 2023-06-21 LAB — ESTRADIOL: Estradiol: 9.9 pg/mL

## 2023-06-21 LAB — FSH/LH
FSH: 73 m[IU]/mL
LH: 38 m[IU]/mL

## 2023-06-24 MED ORDER — CLIMARA PRO 0.045-0.015 MG/DAY TD PTWK: 1.0000 | MEDICATED_PATCH | TRANSDERMAL | 0 refills | Status: DC

## 2023-06-24 NOTE — Addendum Note (Signed)
Addended by: Althea Grimmer B on: 06/24/2023 02:54 AM   Modules accepted: Orders

## 2023-07-02 ENCOUNTER — Encounter: Payer: Self-pay | Admitting: *Deleted

## 2023-08-01 ENCOUNTER — Telehealth: Payer: Self-pay

## 2023-08-01 NOTE — Telephone Encounter (Signed)
 okay

## 2023-08-01 NOTE — Telephone Encounter (Signed)
 Pt requesting call back to schedule colonoscopy.

## 2023-08-29 ENCOUNTER — Ambulatory Visit: Payer: 59 | Admitting: Obstetrics and Gynecology

## 2023-08-29 ENCOUNTER — Encounter: Payer: Self-pay | Admitting: Obstetrics and Gynecology

## 2023-08-29 VITALS — BP 111/67 | HR 69 | Ht 60.0 in | Wt 142.0 lb

## 2023-08-29 DIAGNOSIS — Z79890 Hormone replacement therapy (postmenopausal): Secondary | ICD-10-CM

## 2023-08-29 DIAGNOSIS — Z30432 Encounter for removal of intrauterine contraceptive device: Secondary | ICD-10-CM

## 2023-08-29 DIAGNOSIS — N951 Menopausal and female climacteric states: Secondary | ICD-10-CM

## 2023-08-29 MED ORDER — CLIMARA PRO 0.045-0.015 MG/DAY TD PTWK: 1.0000 | MEDICATED_PATCH | TRANSDERMAL | 3 refills | Status: DC

## 2023-08-29 NOTE — Progress Notes (Signed)
[facility], [place]   Chief Complaint  Patient presents with   Follow-up    Patch, feels much better. IUD removal.    HPI:      [name] is a 53 y.o. G2P1011 whose LMP was No LMP recorded. (Menstrual status: IUD)., presents today for HRT f/u and IUD removal. Was having worse vasomotor sx with insomnia, mood changes at 11/24 annual.  Labs 11/24 confirmed menopause and pt started on HRT, still had Mirena. Pt's sx much improved, very happy with climara pro. Has some breast tenderness and a little cramping when changes patch; no sx otherwise. No vaginal bleeding.  Due for IUD removal.  Plans to do colonoscopy this summer.   Past Medical History:  Diagnosis Date   Perimenopausal vasomotor symptoms     Past Surgical History:  Procedure Laterality Date   BREAST BIOPSY     DILATION AND CURETTAGE OF UTERUS      Family History  Problem Relation Age of Onset   Dementia Mother    Lymphoma Mother    Breast cancer Neg Hx    Ovarian cancer Neg Hx     Social History   Socioeconomic History   Marital status: Married    Spouse name: Not on file   Number of children: Not on file   Years of education: Not on file   Highest education level: Not on file  Occupational History   Not on file  Tobacco Use   Smoking status: Never   Smokeless tobacco: Never  Vaping Use   Vaping status: Never Used  Substance and Sexual Activity   Alcohol use: No   Drug use: No   Sexual activity: Yes    Birth control/protection: I.U.D.    Comment: Mirena  Other Topics Concern   Not on file  Social History Narrative   Not on file   Social Drivers of Health   Financial Resource Strain: Not on file  Food Insecurity: Not on file  Transportation Needs: Not on file  Physical Activity: Not on file  Stress: Not on file  Social Connections: Not on file  Intimate Partner Violence: Not on file    Outpatient Medications Prior to Visit  Medication Sig Dispense Refill    ascorbic acid (VITAMIN C) 100 MG tablet Take by mouth.     Cholecalciferol (VITAMIN D3) 10 MCG (400 UNIT) tablet Take by mouth.     tretinoin (RETIN-A) 0.025 % cream Apply topically at bedtime.     estradiol-levonorgestrel (CLIMARA PRO) 0.045-0.015 MG/DAY Place 1 patch onto the skin once a week. 12 patch 0   levonorgestrel (MIRENA) 20 MCG/24HR IUD 1 Intra Uterine Device (1 each total) once for 1 dose by Intrauterine route. 1 each 0   No facility-administered medications prior to visit.      ROS:  Review of Systems  Constitutional:  Negative for fever.  Gastrointestinal:  Negative for blood in stool, constipation, diarrhea, nausea and vomiting.  Genitourinary:  Negative for dyspareunia, dysuria, flank pain, frequency, hematuria, urgency, vaginal bleeding, vaginal discharge and vaginal pain.  Musculoskeletal:  Negative for back pain.  Skin:  Negative for rash.   BREAST: No symptoms   OBJECTIVE:   Vitals:  BP 111/67   Pulse 69   Ht 5' (1.524 m)   Wt 142 lb (64.4 kg)   BMI 27.73 kg/m   Physical Exam Vitals reviewed.  Constitutional:      Appearance: She is well-developed.  Pulmonary:     Effort:  Pulmonary effort is normal.  Genitourinary:    General: Normal vulva.     Pubic Area: No rash.      Labia:        Right: No rash, tenderness or lesion.        Left: No rash, tenderness or lesion.      Vagina: Normal. No vaginal discharge, erythema or tenderness.     Cervix: Normal.     Uterus: Normal. Not enlarged and not tender.      Adnexa: Right adnexa normal and left adnexa normal.       Right: No mass or tenderness.         Left: No mass or tenderness.       Comments: IUD STRINGS IN CX OS Musculoskeletal:        General: Normal range of motion.     Cervical back: Normal range of motion.  Skin:    General: Skin is warm and dry.  Neurological:     General: No focal deficit present.     Mental Status: She is alert and oriented to person, place, and time.  Psychiatric:         Mood and Affect: Mood normal.        Behavior: Behavior normal.        Thought Content: Thought content normal.        Judgment: Judgment normal.     IUD Removal Strings of IUD identified and grasped.  IUD removed without problem with ring forceps.  Pt tolerated this well.  IUD noted to be intact.   Assessment/Plan: Vasomotor symptoms due to menopause - Plan: estradiol-levonorgestrel (CLIMARA PRO) 0.045-0.015 MG/DAY; doing well with HRT, Rx RF. F/u an annual/sooner prn. F/u prn bleeding.   Hormone replacement therapy (HRT) - Plan: estradiol-levonorgestrel (CLIMARA PRO) 0.045-0.015 MG/DAY  Encounter for IUD removal--tolerated well.   Meds ordered this encounter  Medications   estradiol-levonorgestrel (CLIMARA PRO) 0.045-0.015 MG/DAY    Sig: Place 1 patch onto the skin once a week.    Dispense:  12 patch    Refill:  3    Supervising Provider:   Hildred Laser [[id number]]      Return if symptoms worsen or fail to improve.  Lucillia Corson B. Zylie Mumaw, PA-C 08/29/2023 3:56 PM

## 2023-08-29 NOTE — Patient Instructions (Signed)
I value your feedback and you entrusting [place] with your care. If you get a  patient survey, I would appreciate you taking the time to let us know about your experience today. Thank you! ? ? ?

## 2023-10-14 ENCOUNTER — Ambulatory Visit: Admitting: Obstetrics

## 2023-10-14 ENCOUNTER — Encounter: Payer: Self-pay | Admitting: Obstetrics

## 2023-10-14 VITALS — BP 111/65 | HR 62 | Ht 60.0 in | Wt 141.0 lb

## 2023-10-14 DIAGNOSIS — N816 Rectocele: Secondary | ICD-10-CM

## 2023-10-14 DIAGNOSIS — N8110 Cystocele, unspecified: Secondary | ICD-10-CM | POA: Diagnosis not present

## 2023-10-14 NOTE — Progress Notes (Signed)
 GYNECOLOGY PROGRESS NOTE  Subjective:  PCP: [facility], [place]  Patient ID: Shakelia Scrivner, female    DOB: 1970/10/01, 53 y.o.   MRN: [id number]  HPI  Patient is a 53 y.o. G9P1011 female who presents for possible prolapse, she feels bulging and vaginal pressure, worse with valsalva/straining. She has felt these symptoms on and off for the past year but more noticeable the past 6 weeks. No hx of uterine, bladder, or rectal/bowel surgery.   OB History     Gravida  2   Para  1   Term  1   Preterm      AB  1   Living  1      SAB  1   IAB      Ectopic      Multiple      Live Births            Baby was 8#  Past Medical History:  Diagnosis Date   Perimenopausal vasomotor symptoms    Past Surgical History:  Procedure Laterality Date   BREAST BIOPSY     DILATION AND CURETTAGE OF UTERUS     Family History  Problem Relation Age of Onset   Dementia Mother    Lymphoma Mother    Breast cancer Neg Hx    Ovarian cancer Neg Hx    Social History   Socioeconomic History   Marital status: Married    Spouse name: Not on file   Number of children: Not on file   Years of education: Not on file   Highest education level: Not on file  Occupational History   Not on file  Tobacco Use   Smoking status: Never   Smokeless tobacco: Never  Vaping Use   Vaping status: Never Used  Substance and Sexual Activity   Alcohol use: No   Drug use: No   Sexual activity: Yes    Birth control/protection: None    Comment: Mirena  Other Topics Concern   Not on file  Social History Narrative   Not on file   Social Drivers of Health   Financial Resource Strain: Not on file  Food Insecurity: Not on file  Transportation Needs: Not on file  Physical Activity: Not on file  Stress: Not on file  Social Connections: Not on file  Intimate Partner Violence: Not on file   Current Outpatient Medications on File Prior to Visit  Medication Sig Dispense Refill    ascorbic acid (VITAMIN C) 100 MG tablet Take by mouth.     Cholecalciferol (VITAMIN D3) 10 MCG (400 UNIT) tablet Take by mouth.     estradiol-levonorgestrel (CLIMARA PRO) 0.045-0.015 MG/DAY Place 1 patch onto the skin once a week. 12 patch 3   tretinoin (RETIN-A) 0.025 % cream Apply topically at bedtime.     No current facility-administered medications on file prior to visit.   No Known Allergies  Review of Systems Pertinent items are noted in HPI.   Objective:   Blood pressure 111/65, pulse 62, height 5' (1.524 m), weight 141 lb (64 kg). Body mass index is 27.54 kg/m. Physical Exam Constitutional:      Appearance: Normal appearance.  Genitourinary:     Vulva normal.      Right Adnexa: not tender and no mass present.    Left Adnexa: not tender and no mass present.    No cervical motion tenderness, discharge or friability.     Uterus is not tender or prolapsed.  Uterus is anteverted.  POP-Q measurements were:     Aa: -2, Ba: C:     gH: pB: TVL:     Ap: -2, Bp: D:     Pelvic exam was performed with patient in the lithotomy position.  HENT:     Head: Normocephalic and atraumatic.  Eyes:     Extraocular Movements: Extraocular movements intact.  Pulmonary:     Effort: Pulmonary effort is normal.  Musculoskeletal:        General: Normal range of motion.  Neurological:     General: No focal deficit present.     Mental Status: She is alert.  Psychiatric:        Mood and Affect: Mood normal.  Vitals and nursing note reviewed. Exam conducted with a chaperone present.     Assessment/Plan:   1. Cystocele with rectocele   Jaida Basurto is a 53 y.o. G2P1011 with cystocele, rectocele, symptoms of pressure for a year, but noticeable for the past 6wks.  We discussed pessary trial vs surgical consult and pt desires a pessary trial.  -RTC for fitting when able   Julieanne Manson, DO [facility]

## 2023-11-12 ENCOUNTER — Ambulatory Visit: Admitting: Obstetrics

## 2023-11-12 ENCOUNTER — Encounter: Payer: Self-pay | Admitting: Obstetrics

## 2023-11-12 VITALS — BP 113/69 | HR 70 | Ht 60.0 in | Wt 142.0 lb

## 2023-11-12 DIAGNOSIS — N814 Uterovaginal prolapse, unspecified: Secondary | ICD-10-CM | POA: Diagnosis not present

## 2023-11-12 DIAGNOSIS — Z4689 Encounter for fitting and adjustment of other specified devices: Secondary | ICD-10-CM

## 2023-11-12 DIAGNOSIS — N952 Postmenopausal atrophic vaginitis: Secondary | ICD-10-CM | POA: Diagnosis not present

## 2023-11-12 DIAGNOSIS — N8110 Cystocele, unspecified: Secondary | ICD-10-CM | POA: Insufficient documentation

## 2023-11-12 MED ORDER — ESTRADIOL 0.1 MG/GM VA CREA: 1.0000 | TOPICAL_CREAM | VAGINAL | 3 refills | Status: DC

## 2023-11-12 NOTE — Patient Instructions (Signed)
 How to Use a Vaginal Pessary  A vaginal pessary is a removable device that is placed into your vagina to support pelvic organs that droop. These organs include your uterus, bladder, and rectum. When your pelvic organs drop down into your vagina, it causes a condition called pelvic organ prolapse (POP). A pessary may be an alternative to surgery for women with POP. It may help women who leak urine when they strain or exercise (stress incontinence). This is a symptom of POP. A vaginal pessary may also be a temporary treatment for stress incontinence during pregnancy. There are several types of pessaries. All types are usually made of silicone. You can insert and remove some on your own. Other types must be inserted and removed by your health care provider at office visits. The reason you are using a pessary and the severity of your condition will determine which one is best for you. It is also important to find the right size. A pessary that is too small may fall out. A pessary that is too large may cause pain or discomfort. Your health care provider will do a physical exam to find the correct size and fit for your pessary. It may take several appointments to find the best fit for you. If you can be fit with the type of pessary that you can insert, remove, and clean yourself, your health care provider will teach you how to use your pessary at home. You may have checkups every few months. If you have the type of pessary that needs to be inserted and removed by your health care provider, you will have appointments every few months to have the pessary removed, cleaned, and replaced. What are the risks? When properly fitted and cared for, risks of using a vaginal pessary can be small. However, there can be problems that may include: Vaginal discharge. Vaginal bleeding. A bad smell coming from your vagina. Scraping of the skin inside your vagina. How to use your pessary Follow your health care provider's  instructions for using a pessary. These instructions may vary, depending on the type of pessary you have. To insert a pessary: Wash your hands with soap and water for at least 20 seconds. Squeeze or fold the pessary in half and lubricate the tip with a water-based lubricant. Insert the pessary into your vagina. It will unfold and provide support. To remove the pessary, gently tug it out of your vagina. You can remove the pessary every night or after several days. You can also remove it to have sex. How to care for your pessary If you have a pessary that you can remove: Clean your pessary with soap and water. Rinse well. Dry it completely before inserting it back into your vagina. Follow these instructions at home: Take over-the-counter and prescription medicines only as told by your health care provider. Your health care provider may prescribe an estrogen cream to moisten your vagina. Keep all follow-up visits. This is important. Contact a health care provider if: You feel any pain or discomfort when your pessary is in place. You continue to have stress incontinence. You have trouble keeping your pessary from falling out. You have an unusual vaginal discharge that is blood-tinged or smells bad. Summary A vaginal pessary is a removable device that is placed into your vagina to support pelvic organs that droop. This condition is called pelvic organ prolapse (POP). There are several types of pessaries. Some you can insert and remove on your own. Others must be inserted and  removed by your health care provider. The best type for you depends on the reason you are using a pessary and the severity of your condition. It is also important to find the right size. If you can use the type that you insert and remove on your own, your health care provider will teach you how to use it and schedule checkups every few months. If you have the type that needs to be inserted and removed by your health care  provider, you will have regular appointments to have your pessary removed, cleaned, and replaced. This information is not intended to replace advice given to you by your health care provider. Make sure you discuss any questions you have with your health care provider. Document Revised: 01/14/2020 Document Reviewed: 01/14/2020 Elsevier Patient Education  2024 [employer].

## 2023-11-12 NOTE — Progress Notes (Signed)
    GYNECOLOGY PROGRESS NOTE  Subjective:  PCP: [facility], [place]  Patient ID: Sylvia Perez, female    DOB: 07/27/1971, 53 y.o.   MRN: [id number]  HPI  Patient is a 53 y.o. G3P1011 female who presents for a pessary fitting for cystocele/rectocele. No changes since last visit.   The following portions of the patient's history were reviewed and updated as appropriate: allergies, current medications, past family history, past medical history, past social history, past surgical history, and problem list.  Review of Systems Pertinent items are noted in HPI.   Objective:   Blood pressure 113/69, pulse 70, height 5' (1.524 m), weight 142 lb (64.4 kg). Body mass index is 27.73 kg/m.  General appearance: alert and cooperative Abdomen: soft, non-tender; bowel sounds normal; no masses,  no organomegaly Pelvic: external genitalia normal, no adnexal masses or tenderness, no cervical motion tenderness, rectovaginal septum normal, uterus normal size, shape, and consistency, and atrophic vaginal mucosa Extremities: extremities normal, atraumatic, no cyanosis or edema Neurologic: Grossly normal  Pessary Fitting Patient presents for a pessary fitting. She desires a pessary as her means of controlling her symptoms of prolapse. She understands the care needed for a pessary and desires to proceed. Alternative treatment options have been discussed at length and the patient voices an understanding.  PROCEDURE: The patient was placed in dorsal lithotomy position. Examination confirmed prolapse. A 5 ring pessary was fitted without difficulty. The patient subsequently ambulated, voided and performed valsalva maneuvers without dislodging the pessary and without discomfort. Fitting model removed.    Assessment/Plan:   1. Encounter for fitting and adjustment of pessary   2. Cystocele with rectocele   3. Vaginal atrophy     53 y.o. G2P1011 with POP and atrophic mucosa here for pessary fitting  today, as above. We are out of stock of the #5, will order and have patient return for insertion. Advised should start vaginal estrogen twice weekly and pt amenable, Rx sent. SE and dosing reviewed. She will consider whether she wants to clean/care for pessary at home or have office visits and let us  know at her next fitting.    Sofia Dunn, DO [facility]

## 2023-12-04 NOTE — Progress Notes (Deleted)
 Error

## 2023-12-06 ENCOUNTER — Encounter: Admitting: Obstetrics

## 2023-12-06 ENCOUNTER — Encounter: Payer: Self-pay | Admitting: Obstetrics

## 2023-12-24 NOTE — Progress Notes (Signed)
 Pt not seen.

## 2024-01-06 ENCOUNTER — Encounter: Payer: Self-pay | Admitting: Obstetrics and Gynecology

## 2024-01-08 ENCOUNTER — Other Ambulatory Visit: Payer: Self-pay | Admitting: Obstetrics and Gynecology

## 2024-01-08 ENCOUNTER — Ambulatory Visit (INDEPENDENT_AMBULATORY_CARE_PROVIDER_SITE_OTHER): Admitting: Obstetrics

## 2024-01-08 ENCOUNTER — Encounter: Payer: Self-pay | Admitting: Obstetrics

## 2024-01-08 VITALS — BP 106/61 | HR 62 | Ht 60.0 in | Wt 141.0 lb

## 2024-01-08 DIAGNOSIS — N8110 Cystocele, unspecified: Secondary | ICD-10-CM

## 2024-01-08 DIAGNOSIS — Z4689 Encounter for fitting and adjustment of other specified devices: Secondary | ICD-10-CM

## 2024-01-08 MED ORDER — COMBIPATCH 0.05-0.14 MG/DAY TD PTTW: 1.0000 | MEDICATED_PATCH | TRANSDERMAL | 0 refills | Status: DC

## 2024-01-08 NOTE — Progress Notes (Signed)
 Rx change to combipatch due to insurance formulary. F/u at 11/24 annual

## 2024-01-08 NOTE — Progress Notes (Signed)
  HPI:      [name] is a 53 y.o. G2P1011 who presents today for her pessary insertion Size 5 ring with support, and examination related to her pelvic floor weakening. She has noticed when working out and lifting weights, that despite trying to hold in her pelvic floor muscles and tighten her core, she will still feel bulging and pressure. Is using the vaginal estrace  when she can remember.   PMHx: She  has a past medical history of Perimenopausal vasomotor symptoms. Also,  has a past surgical history that includes Dilation and curettage of uterus and Breast biopsy., family history includes Dementia in her mother; Lymphoma in her mother.,  reports that she has never smoked. She has never used smokeless tobacco. She reports that she does not drink alcohol and does not use drugs.  She has a current medication list which includes the following prescription(s): ascorbic acid, cetirizine, vitamin d3, estradiol , climara  pro, and tretinoin. Also, has no known allergies.  ROS  Objective: BP 106/61   Pulse 62   Ht 5' (1.524 m)   Wt 141 lb (64 kg)   BMI 27.54 kg/m  OBGyn Exam  Pessary Care Device that arrived for pt is ring without support. Had a size 6 with support in stock, offered to try on. Size 6 inserted and patient felt pinching and discomfort, device removed.   A/P: Apologized for mistake Continue vaginal estrace  1-2 x week Ring with support #5 ordered, requested expedited shipping Will call when device arrives and may return for insertion, priority for appt slot   Sylvia Dunn, DO [facility]

## 2024-01-08 NOTE — Patient Instructions (Signed)
 How to Use a Vaginal Pessary  A vaginal pessary is a device that's used to support pelvic organs that have dropped down into your vagina. These organs include your uterus, bladder, and rectum. When your pelvic organs drop down into your vagina, it's called pelvic organ prolapse (POP). A pessary may be used instead of surgery. It may help females who leak urine when they strain or exercise (stress incontinence). Or females who leak poop. A vaginal pessary may also be a temporary treatment for stress incontinence during pregnancy. What are other things to know about a pessary? There're several types of pessaries. All types are usually made of silicone. The right one for you depends on the reason you need a pessary, how bad your condition is, and whether you're sexually active. It's important to find a pessary that's the right size. A pessary that's too small may fall out. A pessary that is too large may cause pain, discomfort, and sores on the inside of the vagina. Your health care provider will do a physical exam to find the correct size and fit for your pessary. It may take several visits to find the best fit for you. You can insert and remove some pessaries on your own. If you have this type of pessary, your provider will teach you how to use, clean, and care for your device at home. You'll have check-ups every few months. Your provider must insert and remove some other types of pessaries. If you have this type of pessary, you'll have follow-up visits every few months to have the device removed, cleaned, and replaced. What are the risks? The risks are low when the pessary is properly fitted and cared for. However, there can be problems that include: Fluid coming from your vagina (discharge). Bleeding from your vagina. A bad smell coming from your vagina. Ulcers or skin erosion. Infection. How to use your pessary Follow your provider's instructions for using a pessary. These instructions may vary,  depending on the type of pessary you have. To insert it, you can try lying down with your legs apart, or standing with one leg up on the tub or a step. To insert a pessary: Wash your hands with soap and water for at least 20 seconds. Squeeze or fold the pessary in half and lubricate the tip with a water-based lubricant. Insert the pessary into your vagina. It will unfold and provide support. To remove the pessary, gently pull it out of your vagina. You can remove the pessary every night or after several days. You can also remove it to have sex. Some devices may have metal and will need to be removed before an X-ray or MRI. Like a tampon, you should not be able to feel the pessary while it is inserted. If you do, it may not be the correct size. How to care for your pessary If you have a pessary that can be removed: Clean your pessary with soap and water mixture. Let it soak in the mixture for 5 minutes. Clean the device with a toothbrush or small scrub brush in the soap and water mixture. Rinse the device under running water for at least 30 seconds. Dry it completely before inserting it back into your vagina. Ask your provider about douches or any cleaners that you use in your vagina. These may not be good for the silicone pessary. Follow these instructions at home: Take your medicines only as told. Your provider may prescribe an estrogen cream to strengthen the tissue in your  vagina. Keep all follow-up visits. Your provider may need to check that the pessary is fitting and working correctly. Contact a health care provider if: You feel any pain or discomfort when your pessary is in place. You continue to leak pee when you cough, laugh, or strain. You have trouble keeping your pessary from falling out. You have a smelly, blood-tinged fluid coming from your vagina. This information is not intended to replace advice given to you by your health care provider. Make sure you discuss any questions  you have with your health care provider. Document Revised: 06/25/2023 Document Reviewed: 06/25/2023 Elsevier Patient Education  2025 [employer].

## 2024-02-13 LAB — HM MAMMOGRAPHY

## 2024-02-17 ENCOUNTER — Encounter: Payer: Self-pay | Admitting: Obstetrics and Gynecology

## 2024-03-05 ENCOUNTER — Encounter: Payer: Self-pay | Admitting: Obstetrics and Gynecology

## 2024-03-17 ENCOUNTER — Ambulatory Visit: Admitting: Obstetrics

## 2024-03-24 ENCOUNTER — Encounter: Payer: Self-pay | Admitting: Obstetrics

## 2024-03-24 ENCOUNTER — Ambulatory Visit: Admitting: Obstetrics

## 2024-03-24 VITALS — BP 99/70 | HR 69 | Ht 60.0 in | Wt 144.0 lb

## 2024-03-24 DIAGNOSIS — N952 Postmenopausal atrophic vaginitis: Secondary | ICD-10-CM

## 2024-03-24 DIAGNOSIS — N814 Uterovaginal prolapse, unspecified: Secondary | ICD-10-CM | POA: Diagnosis not present

## 2024-03-24 DIAGNOSIS — Z4689 Encounter for fitting and adjustment of other specified devices: Secondary | ICD-10-CM | POA: Diagnosis not present

## 2024-03-24 DIAGNOSIS — N8110 Cystocele, unspecified: Secondary | ICD-10-CM

## 2024-03-24 NOTE — Progress Notes (Unsigned)
  HPI:      Sylvia Perez is a 53 y.o. G2P1011 who presents today for her pessary insertion and examination related to her pelvic floor weakening.  She was fitted for a size 5 ring with support in April '25, came back in June for the fitting, and the wrong device was ordered. She is here today to have her pessary finally inserted.   PMHx: She  has a past medical history of Perimenopausal vasomotor symptoms. Also,  has a past surgical history that includes Dilation and curettage of uterus and Breast biopsy., family history includes Dementia in her mother; Lymphoma in her mother.,  reports that she has never smoked. She has never used smokeless tobacco. She reports that she does not drink alcohol and does not use drugs.  She has a current medication list which includes the following prescription(s): ascorbic acid, cetirizine, vitamin d3, combipatch , tretinoin, and estradiol . Also, has no known allergies.  ROS  Objective: BP 99/70   Pulse 69   Ht 5' (1.524 m)   Wt 144 lb (65.3 kg)   LMP 06/29/2017   BMI 28.12 kg/m  Physical Exam Constitutional:      Appearance: Normal appearance.  Genitourinary:     Vulva normal.     No labial fusion noted.     Anterior and posterior vaginal prolapse present.    Mild vaginal atrophy present. HENT:     Head: Normocephalic and atraumatic.  Eyes:     Extraocular Movements: Extraocular movements intact.  Pulmonary:     Effort: Pulmonary effort is normal.  Musculoskeletal:        General: Normal range of motion.  Neurological:     Mental Status: She is alert.  Vitals and nursing note reviewed. Exam conducted with a chaperone present.    Pessary Care #5 Ring with support inserted and placed without difficulty.  Pessary insertion/removal teaching performed today, pt unable to dislodge pessary on her own.   A/P: Encounter Diagnoses  Name Primary?   Cystocele with rectocele [N81.10, N81.6] Yes   Encounter for fitting and adjustment of  pessary    Vaginal atrophy    #5 ring with support was inserted today as initial insertion.  Instructions for home discussed, reassurance that we can keep practicing and attempting for home care.  Concerning symptoms to observe for are counseled to patient. Follow up scheduled for 3 months -- annual with pessary change  Total time was 35 minutes. That includes chart review before the visit, the actual patient visit, and time spent on documentation after the visit. Time excludes procedures, if any.    [name], DO [facility]

## 2024-05-19 ENCOUNTER — Other Ambulatory Visit: Payer: Self-pay | Admitting: Obstetrics and Gynecology

## 2024-05-19 ENCOUNTER — Telehealth: Payer: Self-pay

## 2024-05-19 MED ORDER — COMBIPATCH 0.05-0.14 MG/DAY TD PTTW: 1.0000 | MEDICATED_PATCH | TRANSDERMAL | 0 refills | Status: DC

## 2024-05-19 NOTE — Telephone Encounter (Signed)
Rx RF eRxd.  

## 2024-05-19 NOTE — Progress Notes (Signed)
 Rx RF combipatch  till annual

## 2024-05-20 ENCOUNTER — Encounter: Payer: Self-pay | Admitting: Obstetrics and Gynecology

## 2024-06-19 NOTE — Progress Notes (Signed)
 ANNUAL PREVENTATIVE CARE GYNECOLOGY  ENCOUNTER NOTE  SUBJECTIVE:       Sylvia Perez is a 53 y.o. G13P1011 female here for a routine annual gynecologic exam. The patient is sexually active. The patient is taking hormone replacement therapy. Patient denies post-menopausal vaginal bleeding. Family history of breast, uterine, ovarian cancer: no. The patient wears seatbelts: yes. The patient participates in regular exercise: yes. Has the patient ever been transfused or tattooed?: yes. The patient reports that there is not domestic violence in her life. Has the patient completed the Gardasil vaccine? no.  Current complaints: 1.  Patient also here for pessary check.  Size 5 ring with support placed 03/24/24. Is hleping a lot 2. Needs refills on HRT: is on combipatch  and vaginal Estrace    Gynecologic History Patient's last menstrual period was 06/29/2017. Contraception: post menopausal status Last Pap: 03/27/2022. Results were: normal History of abnormal pap: none History of STIs: none Last Mammogram: 02/13/2024. Results were: normal Last Colonoscopy:  never done Last Dexa Scan: NA  PHQ-2:     07/01/2024    8:12 AM  Depression screen PHQ 2/9  Decreased Interest 0  Down, Depressed, Hopeless 0  PHQ - 2 Score 0    Obstetric History OB History  Gravida Para Term Preterm AB Living  2 1 1  1 1   SAB IAB Ectopic Multiple Live Births  1        # Outcome Date GA Lbr Len/2nd Weight Sex Type Anes PTL Lv  2 SAB           1 Term             Past Medical History:  Diagnosis Date   Perimenopausal vasomotor symptoms     Family History  Problem Relation Age of Onset   Dementia Mother    Lymphoma Mother    Breast cancer Neg Hx    Ovarian cancer Neg Hx     Past Surgical History:  Procedure Laterality Date   BREAST BIOPSY     DILATION AND CURETTAGE OF UTERUS      Social History   Socioeconomic History   Marital status: Married    Spouse name: Not on file   Number of  children: Not on file   Years of education: Not on file   Highest education level: Not on file  Occupational History   Not on file  Tobacco Use   Smoking status: Never   Smokeless tobacco: Never  Vaping Use   Vaping status: Never Used  Substance and Sexual Activity   Alcohol use: No   Drug use: No   Sexual activity: Yes    Birth control/protection: None    Comment: Mirena   Other Topics Concern   Not on file  Social History Narrative   Not on file   Social Drivers of Health   Financial Resource Strain: Not on file  Food Insecurity: Not on file  Transportation Needs: Not on file  Physical Activity: Not on file  Stress: Not on file  Social Connections: Not on file  Intimate Partner Violence: Not on file    Current Outpatient Medications on File Prior to Visit  Medication Sig Dispense Refill   ascorbic acid (VITAMIN C) 100 MG tablet Take by mouth.     cetirizine (ZYRTEC ALLERGY) 10 MG tablet      Cholecalciferol (VITAMIN D3) 10 MCG (400 UNIT) tablet Take by mouth.     tretinoin (RETIN-A) 0.025 % cream Apply topically at bedtime.  No current facility-administered medications on file prior to visit.    No Known Allergies   Review of Systems ROS Review of Systems - General ROS: negative for - chills, fatigue, fever, hot flashes, night sweats, weight gain or weight loss Psychological ROS: negative for - anxiety, decreased libido, depression, mood swings, physical abuse or sexual abuse Ophthalmic ROS: negative for - blurry vision, eye pain or loss of vision ENT ROS: negative for - headaches, hearing change, visual changes or vocal changes Allergy and Immunology ROS: negative for - hives, itchy/watery eyes or seasonal allergies Hematological and Lymphatic ROS: negative for - bleeding problems, bruising, swollen lymph nodes or weight loss Endocrine ROS: negative for - galactorrhea, hair pattern changes, hot flashes, malaise/lethargy, mood swings, palpitations,  polydipsia/polyuria, skin changes, temperature intolerance or unexpected weight changes Breast ROS: negative for - new or changing breast lumps or nipple discharge Respiratory ROS: negative for - cough or shortness of breath Cardiovascular ROS: negative for - chest pain, irregular heartbeat, palpitations or shortness of breath Gastrointestinal ROS: no abdominal pain, change in bowel habits, or black or bloody stools Genito-Urinary ROS: no dysuria, trouble voiding, or hematuria Musculoskeletal ROS: negative for - joint pain or joint stiffness Neurological ROS: negative for - bowel and bladder control changes Dermatological ROS: negative for rash and skin lesion changes   OBJECTIVE:   BP (!) 101/58   Pulse 71   Wt 145 lb 9.6 oz (66 kg)   LMP 06/29/2017   BMI 28.44 kg/m   CONSTITUTIONAL: Well-developed, well-nourished female in no acute distress.  PSYCHIATRIC: Normal mood and affect. Normal behavior. Normal judgment and thought content. NEUROLGIC: Alert and oriented to person, place, and time. Normal muscle tone coordination. No cranial nerve deficit noted. HENT:  Normocephalic, atraumatic, External right and left ear normal. Oropharynx is clear and moist EYES: Conjunctivae and EOM are normal. No scleral icterus.  NECK: Normal range of motion, supple, no masses.  Normal thyroid.  SKIN: Skin is warm and dry. No rash noted. Not diaphoretic. No erythema. No pallor. CARDIOVASCULAR: Normal heart rate noted, regular rhythm, no murmur. RESPIRATORY: Clear to auscultation bilaterally. Effort and breath sounds normal, no problems with respiration noted. BREASTS: Symmetric in size. No masses, skin changes, nipple drainage, or lymphadenopathy. ABDOMEN: Soft, normal bowel sounds, no distention noted.  No tenderness, rebound or guarding.  PELVIC:  Bladder no bladder distension noted  Urethra: normal appearing urethra with no masses, tenderness or lesions  Vulva: normal appearing vulva with no masses,  tenderness or lesions  Vagina: normal appearing vagina with normal color and discharge, no lesions  Cervix: normal appearing cervix without discharge or lesions  Uterus: uterus is normal size, shape, consistency and nontender  Adnexa: normal adnexa in size, nontender and no masses  RV: External Exam NormaI, No Rectal Masses, and Normal Sphincter tone  MUSCULOSKELETAL: Normal range of motion. No tenderness.  No cyanosis, clubbing, or edema.  2+ distal pulses. LYMPHATIC: No Axillary, Supraclavicular, or Inguinal Adenopathy.  Labs: No results found for: WBC, HGB, HCT, MCV, PLT  Lab Results  Component Value Date   CREATININE 0.84 07/27/2022   BUN 16 07/27/2022   NA 141 07/27/2022   K 4.4 07/27/2022   CL 103 07/27/2022   CO2 25 07/27/2022    Lab Results  Component Value Date   ALT 25 07/27/2022   AST 23 07/27/2022   ALKPHOS 68 07/27/2022   BILITOT 0.4 07/27/2022    Lab Results  Component Value Date   CHOL 160 07/27/2022  HDL 53 07/27/2022   LDLCALC 95 07/27/2022   TRIG 58 07/27/2022   CHOLHDL 3.0 07/27/2022    No results found for: TSH  No results found for: HGBA1C   ASSESSMENT:   1. Well woman exam with routine gynecological exam   2. Colon cancer screening   3. Hormone replacement therapy (HRT)   4. Breast cancer screening by mammogram   5. Maternal family history of dementia      PLAN:   Sylvia Perez is a 54 y.o. G70P1011 female here today for her annual exam, doing well.  Pap: due 02/2027 Mammogram: ordered, due 01/2025 Colon: No prior; ordered colonoscopy Labs: PCP PHQ-2 = 0 Contraception: post-menopausal Healthy lifestyle modifications discussed: multivitamin, diet, exercise, sunscreen, tobacco and alcohol use. Emphasized importance of regular physical activity.   [facility] of dementia -Pt's mother with gene for dementia, pt has not been tested, is considering -Reviewed preventative lifestyle factors she can implement and is already doing  many -Add Creatine to daily supplements; is already doing Omega-3s, exercise, doesn't drink ETOH, and eats plant-based diet, does brain stimulating activities.   HRT:  -Refilled Combipatch  and Estrace  x 1 yr  Pessary:  -Is now doing home maintenance, discussed cleaning and care -Rx for Trimosan sent -Follow up prn  All questions answered to patient's satisfaction.  Follow up 1 yr for annual, sooner prn.    [name], DO [facility]

## 2024-07-01 ENCOUNTER — Ambulatory Visit: Admitting: Obstetrics

## 2024-07-01 ENCOUNTER — Encounter: Payer: Self-pay | Admitting: Obstetrics

## 2024-07-01 ENCOUNTER — Telehealth: Payer: Self-pay

## 2024-07-01 ENCOUNTER — Other Ambulatory Visit: Payer: Self-pay

## 2024-07-01 VITALS — BP 101/58 | HR 71 | Wt 145.6 lb

## 2024-07-01 DIAGNOSIS — Z1211 Encounter for screening for malignant neoplasm of colon: Secondary | ICD-10-CM

## 2024-07-01 DIAGNOSIS — Z79890 Hormone replacement therapy (postmenopausal): Secondary | ICD-10-CM

## 2024-07-01 DIAGNOSIS — Z01419 Encounter for gynecological examination (general) (routine) without abnormal findings: Secondary | ICD-10-CM

## 2024-07-01 DIAGNOSIS — Z818 Family history of other mental and behavioral disorders: Secondary | ICD-10-CM | POA: Diagnosis not present

## 2024-07-01 DIAGNOSIS — Z1231 Encounter for screening mammogram for malignant neoplasm of breast: Secondary | ICD-10-CM

## 2024-07-01 MED ORDER — NA SULFATE-K SULFATE-MG SULF 17.5-3.13-1.6 GM/177ML PO SOLN: 354.0000 mL | Freq: Once | ORAL | 0 refills | Status: AC

## 2024-07-01 MED ORDER — ESTRADIOL 0.01 % VA CREA: 1.0000 | TOPICAL_CREAM | VAGINAL | 3 refills | Status: AC

## 2024-07-01 MED ORDER — COMBIPATCH 0.05-0.14 MG/DAY TD PTTW: 1.0000 | MEDICATED_PATCH | TRANSDERMAL | 3 refills | Status: AC

## 2024-07-01 NOTE — Telephone Encounter (Signed)
 Gastroenterology Pre-Procedure Review  Request Date: 09/03/2024 Requesting Physician: Dr. Jinny  PATIENT REVIEW QUESTIONS: The patient responded to the following health history questions as indicated:    1. Are you having any GI issues? no 2. Do you have a personal history of Polyps? no 3. Do you have a family history of Colon Cancer or Polyps? no 4. Diabetes Mellitus? no 5. Joint replacements in the past 12 months?no 6. Major health problems in the past 3 months?no 7. Any artificial heart valves, MVP, or defibrillator?no    MEDICATIONS & ALLERGIES:    Patient reports the following regarding taking any anticoagulation/antiplatelet therapy:   Plavix, Coumadin, Eliquis, Xarelto, Lovenox, Pradaxa, Brilinta, or Effient? no Aspirin? no  Patient confirms/reports the following medications:  Current Outpatient Medications  Medication Sig Dispense Refill   ascorbic acid (VITAMIN C) 100 MG tablet Take by mouth.     cetirizine (ZYRTEC ALLERGY) 10 MG tablet      Cholecalciferol (VITAMIN D3) 10 MCG (400 UNIT) tablet Take by mouth.     estradiol  (ESTRACE ) 0.01 % CREA vaginal cream Place 1 Applicatorful vaginally 2 (two) times a week. 90 g 3   [START ON 07/02/2024] estradiol -norethindrone (COMBIPATCH ) 0.05-0.14 MG/DAY Place 1 patch onto the skin 2 (two) times a week. 24 patch 3   tretinoin (RETIN-A) 0.025 % cream Apply topically at bedtime.     No current facility-administered medications for this visit.    Patient confirms/reports the following allergies:  No Known Allergies  No orders of the defined types were placed in this encounter.   AUTHORIZATION INFORMATION Primary Insurance: 1D#: Group #:  Secondary Insurance: 1D#: Group #:  SCHEDULE INFORMATION: Date: 09/03/2024 Time: Location: [facility] Dr. Jinny

## 2024-07-01 NOTE — Patient Instructions (Signed)
 Start taking Creatine Monohydrate: 10-15g daily for brain health, to help sustain your muscle mass as you age, and to help with sleep.

## 2024-09-03 ENCOUNTER — Encounter: Payer: Self-pay | Admitting: Gastroenterology

## 2024-09-03 ENCOUNTER — Ambulatory Visit
Admission: RE | Admit: 2024-09-03 | Discharge: 2024-09-03 | Disposition: A | Source: Home / Self Care | Attending: Gastroenterology | Admitting: Gastroenterology

## 2024-09-03 ENCOUNTER — Encounter: Admission: RE | Disposition: A | Payer: Self-pay | Source: Home / Self Care | Attending: Gastroenterology

## 2024-09-03 DIAGNOSIS — Z1211 Encounter for screening for malignant neoplasm of colon: Secondary | ICD-10-CM

## 2024-09-03 MED ORDER — SODIUM CHLORIDE 0.9 % IV SOLN: INTRAVENOUS | Status: DC

## 2024-09-03 NOTE — OR Nursing (Signed)
 Patient not cleaned out. Need to reschedule
